# Patient Record
Sex: Male | Born: 1985 | Race: White | Hispanic: No | Marital: Single | Smoking: Current every day smoker
Health system: Southern US, Community
[De-identification: ages and names within clinical notes are randomized; demographics above are authoritative.]

## PROBLEM LIST (undated history)

## (undated) DIAGNOSIS — S21112A Laceration without foreign body of left front wall of thorax without penetration into thoracic cavity, initial encounter: Secondary | ICD-10-CM

## (undated) DIAGNOSIS — F909 Attention-deficit hyperactivity disorder, unspecified type: Secondary | ICD-10-CM

## (undated) DIAGNOSIS — J339 Nasal polyp, unspecified: Secondary | ICD-10-CM

## (undated) DIAGNOSIS — F319 Bipolar disorder, unspecified: Secondary | ICD-10-CM

## (undated) DIAGNOSIS — S0990XA Unspecified injury of head, initial encounter: Secondary | ICD-10-CM

## (undated) HISTORY — PX: VIDEO ASSISTED THORACOSCOPY (VATS)/ LOBECTOMY: SHX6169

---
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
## 2005-06-28 ENCOUNTER — Emergency Department (HOSPITAL_COMMUNITY): Admission: EM | Admit: 2005-06-28 | Discharge: 2005-06-28 | Payer: Self-pay | Admitting: Family Medicine

## 2005-10-29 ENCOUNTER — Emergency Department (HOSPITAL_COMMUNITY): Admission: AD | Admit: 2005-10-29 | Discharge: 2005-10-29 | Payer: Self-pay | Admitting: Family Medicine

## 2005-11-25 ENCOUNTER — Emergency Department (HOSPITAL_COMMUNITY): Admission: EM | Admit: 2005-11-25 | Discharge: 2005-11-25 | Payer: Self-pay | Admitting: Family Medicine

## 2011-01-28 ENCOUNTER — Emergency Department (HOSPITAL_COMMUNITY): Payer: Self-pay

## 2011-01-28 ENCOUNTER — Emergency Department (HOSPITAL_COMMUNITY)
Admission: EM | Admit: 2011-01-28 | Discharge: 2011-01-28 | Disposition: A | Discharge status: Home / Self Care | Payer: Self-pay | Attending: Emergency Medicine | Admitting: Emergency Medicine

## 2011-01-28 DIAGNOSIS — R61 Generalized hyperhidrosis: Secondary | ICD-10-CM | POA: Insufficient documentation

## 2011-01-28 DIAGNOSIS — T675XXA Heat exhaustion, unspecified, initial encounter: Secondary | ICD-10-CM | POA: Insufficient documentation

## 2011-01-28 DIAGNOSIS — R55 Syncope and collapse: Secondary | ICD-10-CM | POA: Insufficient documentation

## 2011-01-28 DIAGNOSIS — R5381 Other malaise: Secondary | ICD-10-CM | POA: Insufficient documentation

## 2011-01-28 DIAGNOSIS — N390 Urinary tract infection, site not specified: Secondary | ICD-10-CM | POA: Insufficient documentation

## 2011-01-28 DIAGNOSIS — R0609 Other forms of dyspnea: Secondary | ICD-10-CM | POA: Insufficient documentation

## 2011-01-28 DIAGNOSIS — X30XXXA Exposure to excessive natural heat, initial encounter: Secondary | ICD-10-CM | POA: Insufficient documentation

## 2011-01-28 DIAGNOSIS — R42 Dizziness and giddiness: Secondary | ICD-10-CM | POA: Insufficient documentation

## 2011-01-28 DIAGNOSIS — R109 Unspecified abdominal pain: Secondary | ICD-10-CM | POA: Insufficient documentation

## 2011-01-28 DIAGNOSIS — R300 Dysuria: Secondary | ICD-10-CM | POA: Insufficient documentation

## 2011-01-28 DIAGNOSIS — R0989 Other specified symptoms and signs involving the circulatory and respiratory systems: Secondary | ICD-10-CM | POA: Insufficient documentation

## 2011-01-28 DIAGNOSIS — R631 Polydipsia: Secondary | ICD-10-CM | POA: Insufficient documentation

## 2011-01-28 LAB — DIFFERENTIAL
Basophils Absolute: 0 10*3/uL (ref 0.0–0.1)
Basophils Relative: 0 % (ref 0–1)
Eosinophils Absolute: 0.1 10*3/uL (ref 0.0–0.7)
Eosinophils Relative: 1 % (ref 0–5)
Lymphocytes Relative: 18 % (ref 12–46)
Lymphs Abs: 1.2 10*3/uL (ref 0.7–4.0)
Monocytes Absolute: 0.6 10*3/uL (ref 0.1–1.0)
Monocytes Relative: 9 % (ref 3–12)
Neutro Abs: 4.8 10*3/uL (ref 1.7–7.7)
Neutrophils Relative %: 72 % (ref 43–77)

## 2011-01-28 LAB — POCT I-STAT, CHEM 8
BUN: 10 mg/dL (ref 6–23)
Calcium, Ion: 1.22 mmol/L (ref 1.12–1.32)
Chloride: 105 mEq/L (ref 96–112)
Creatinine, Ser: 1 mg/dL (ref 0.50–1.35)
Glucose, Bld: 124 mg/dL — ABNORMAL HIGH (ref 70–99)
HCT: 46 % (ref 39.0–52.0)
Hemoglobin: 15.6 g/dL (ref 13.0–17.0)
Potassium: 3.2 mEq/L — ABNORMAL LOW (ref 3.5–5.1)
Sodium: 142 mEq/L (ref 135–145)
TCO2: 23 mmol/L (ref 0–100)

## 2011-01-28 LAB — COMPREHENSIVE METABOLIC PANEL
AST: 14 U/L (ref 0–37)
BUN: 10 mg/dL (ref 6–23)
CO2: 26 mEq/L (ref 19–32)
Chloride: 107 mEq/L (ref 96–112)
Creatinine, Ser: 0.88 mg/dL (ref 0.50–1.35)
GFR calc Af Amer: >60 mL/min (ref >60)
GFR calc non Af Amer: >60 mL/min (ref >60)
Glucose, Bld: 123 mg/dL — ABNORMAL HIGH (ref 70–99)
Total Bilirubin: 0.5 mg/dL (ref 0.3–1.2)

## 2011-01-28 LAB — CBC
Hemoglobin: 15.2 g/dL (ref 13.0–17.0)
MCHC: 35.3 g/dL (ref 30.0–36.0)
RDW: 13.8 % (ref 11.5–15.5)
WBC: 6.7 10*3/uL (ref 4.0–10.5)

## 2011-01-28 LAB — CK: Total CK: 188 U/L (ref 7–232)

## 2011-01-28 LAB — URINALYSIS, ROUTINE W REFLEX MICROSCOPIC
Protein, ur: 100 mg/dL — AB
Specific Gravity, Urine: 1.028 (ref 1.005–1.030)
Urobilinogen, UA: 1 mg/dL (ref 0.0–1.0)

## 2011-01-28 LAB — URINE MICROSCOPIC-ADD ON

## 2011-01-29 LAB — GC/CHLAMYDIA PROBE AMP, URINE: GC Probe Amp, Urine: NEGATIVE

## 2011-09-21 ENCOUNTER — Emergency Department (HOSPITAL_COMMUNITY)
Admission: EM | Admit: 2011-09-21 | Discharge: 2011-09-22 | Disposition: A | Discharge status: Home / Self Care | Payer: Self-pay | Attending: Emergency Medicine | Admitting: Emergency Medicine

## 2011-09-21 ENCOUNTER — Encounter (HOSPITAL_COMMUNITY): Payer: Self-pay | Admitting: Emergency Medicine

## 2011-09-21 DIAGNOSIS — S21109A Unspecified open wound of unspecified front wall of thorax without penetration into thoracic cavity, initial encounter: Secondary | ICD-10-CM | POA: Insufficient documentation

## 2011-09-21 DIAGNOSIS — F909 Attention-deficit hyperactivity disorder, unspecified type: Secondary | ICD-10-CM | POA: Insufficient documentation

## 2011-09-21 DIAGNOSIS — S02400A Malar fracture unspecified, initial encounter for closed fracture: Secondary | ICD-10-CM | POA: Insufficient documentation

## 2011-09-21 DIAGNOSIS — M2550 Pain in unspecified joint: Secondary | ICD-10-CM | POA: Insufficient documentation

## 2011-09-21 DIAGNOSIS — T07XXXA Unspecified multiple injuries, initial encounter: Secondary | ICD-10-CM | POA: Insufficient documentation

## 2011-09-21 DIAGNOSIS — M25579 Pain in unspecified ankle and joints of unspecified foot: Secondary | ICD-10-CM | POA: Insufficient documentation

## 2011-09-21 DIAGNOSIS — S02401A Maxillary fracture, unspecified, initial encounter for closed fracture: Secondary | ICD-10-CM | POA: Insufficient documentation

## 2011-09-21 DIAGNOSIS — IMO0001 Reserved for inherently not codable concepts without codable children: Secondary | ICD-10-CM | POA: Insufficient documentation

## 2011-09-21 DIAGNOSIS — S0181XA Laceration without foreign body of other part of head, initial encounter: Secondary | ICD-10-CM

## 2011-09-21 DIAGNOSIS — S0180XA Unspecified open wound of other part of head, initial encounter: Secondary | ICD-10-CM | POA: Insufficient documentation

## 2011-09-21 DIAGNOSIS — R221 Localized swelling, mass and lump, neck: Secondary | ICD-10-CM | POA: Insufficient documentation

## 2011-09-21 DIAGNOSIS — M79609 Pain in unspecified limb: Secondary | ICD-10-CM | POA: Insufficient documentation

## 2011-09-21 DIAGNOSIS — R220 Localized swelling, mass and lump, head: Secondary | ICD-10-CM | POA: Insufficient documentation

## 2011-09-21 DIAGNOSIS — IMO0002 Reserved for concepts with insufficient information to code with codable children: Secondary | ICD-10-CM | POA: Insufficient documentation

## 2011-09-21 HISTORY — DX: Attention-deficit hyperactivity disorder, unspecified type: F90.9

## 2011-09-21 HISTORY — DX: Unspecified injury of head, initial encounter: S09.90XA
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
## 2011-09-22 ENCOUNTER — Emergency Department (HOSPITAL_COMMUNITY): Payer: Self-pay

## 2011-09-22 MED ORDER — TETANUS-DIPHTH-ACELL PERTUSSIS 5-2.5-18.5 LF-MCG/0.5 IM SUSP
0.5000 mL | Freq: Once | INTRAMUSCULAR | Status: AC
  Administered 2011-09-22: 0.5 mL via INTRAMUSCULAR

## 2011-09-22 MED ORDER — LIDOCAINE HCL (PF) 1 % IJ SOLN
5.0000 mL | Freq: Once | INTRAMUSCULAR | Status: AC
  Administered 2011-09-22: 5 mL via INTRADERMAL
  Filled 2011-09-22: qty 5

## 2011-09-22 MED ORDER — OXYCODONE-ACETAMINOPHEN 5-325 MG PO TABS: 1.0000 | ORAL_TABLET | ORAL | Status: AC | PRN

## 2011-09-22 MED ORDER — IBUPROFEN 800 MG PO TABS: 800.0000 mg | ORAL_TABLET | Freq: Three times a day (TID) | ORAL | Status: AC

## 2011-09-22 MED ORDER — DOUBLE ANTIBIOTIC 500-10000 UNIT/GM EX OINT
TOPICAL_OINTMENT | Freq: Once | CUTANEOUS | Status: AC
  Administered 2011-09-22: 1 via TOPICAL
  Filled 2011-09-22: qty 1

## 2011-09-22 MED ORDER — AMOXICILLIN-POT CLAVULANATE 875-125 MG PO TABS: 1.0000 | ORAL_TABLET | Freq: Two times a day (BID) | ORAL | Status: DC

## 2011-09-22 MED ORDER — IBUPROFEN 800 MG PO TABS
800.0000 mg | ORAL_TABLET | Freq: Once | ORAL | Status: AC
  Administered 2011-09-22: 800 mg via ORAL
  Filled 2011-09-22: qty 1

## 2011-09-22 MED ORDER — OXYCODONE-ACETAMINOPHEN 5-325 MG PO TABS
1.0000 | ORAL_TABLET | Freq: Once | ORAL | Status: AC
  Administered 2011-09-22: 1 via ORAL
  Filled 2011-09-22: qty 1

## 2011-09-22 MED ORDER — TETANUS-DIPHTH-ACELL PERTUSSIS 5-2.5-18.5 LF-MCG/0.5 IM SUSP
INTRAMUSCULAR | Status: AC
  Filled 2011-09-22: qty 0.5

## 2011-09-22 NOTE — Discharge Instructions (Signed)
Abrasions Abrasions are skin scrapes. Their treatment depends on how large and deep the abrasion is. Abrasions do not extend through all layers of the skin. A cut or lesion through all skin layers is called a laceration. HOME CARE INSTRUCTIONS   If you were given a dressing, change it at least once a day or as instructed by your caregiver. If the bandage sticks, soak it off with a solution of water or hydrogen peroxide.   Twice a day, wash the area with soap and water to remove all the cream/ointment. You may do this in a sink, under a tub faucet, or in a shower. Rinse off the soap and pat dry with a clean towel. Look for signs of infection (see below).   Reapply cream/ointment according to your caregiver's instruction. This will help prevent infection and keep the bandage from sticking. Telfa or gauze over the wound and under the dressing or wrap will also help keep the bandage from sticking.   If the bandage becomes wet, dirty, or develops a foul smell, change it as soon as possible.   Only take over-the-counter or prescription medicines for pain, discomfort, or fever as directed by your caregiver.  SEEK IMMEDIATE MEDICAL CARE IF:   Increasing pain in the wound.   Signs of infection develop: redness, swelling, surrounding area is tender to touch, or pus coming from the wound.   You have a fever.   Any foul smell coming from the wound or dressing.  Most skin wounds heal within ten days. Facial wounds heal faster. However, an infection may occur despite proper treatment. You should have the wound checked for signs of infection within 24 to 48 hours or sooner if problems arise. If you were not given a wound-check appointment, look closely at the wound yourself on the second day for early signs of infection listed above. MAKE SURE YOU:   Understand these instructions.   Will watch your condition.   Will get help right away if you are not doing well or get worse.  Document Released:  02/16/2005 Document Revised: 04/28/2011 Document Reviewed: 04/12/2011 [facility] Patient Information 2012 [place], [place].Abrasions An abrasion is a scraped area on the skin. Abrasions do not go through all layers of the skin.  HOME CARE  Change any bandages (dressings) as told by your doctor. If the bandage sticks, soak it off with warm, soapy water. Change the bandage if it gets wet, dirty, or starts to smell.   Wash the area with soap and water twice a day. Rinse off the soap. Pat the area dry with a clean towel.   Look at the injured area for signs of infection. Infection signs include redness, puffiness (swelling), tenderness, or yellowish white fluid (pus) coming from the wound.   Apply medicated cream as told by your doctor.   Only take medicine as told by your doctor.   Follow up with your doctor as told.  GET HELP RIGHT AWAY IF:   You have more pain in your wound.   You have redness, puffiness (swelling), or tenderness around your wound.   You have yellowish white fluid (pus) coming from your wound.   You have a fever.   A bad smell is coming from the wound or bandage.  MAKE SURE YOU:   Understand these instructions.   Will watch your condition.   Will get help right away if you are not doing well or get worse.  Document Released: 10/26/2007 Document Revised: 04/28/2011 Document Reviewed: 04/12/2011 [employer]  Patient Information 2012 [place], [place].Facial Fracture A facial fracture is a break in one of the bones of your face. HOME CARE INSTRUCTIONS   Protect the injured part of your face until it is healed.   Do not participate in activities which give chance for re-injury until your doctor approves.   Gently wash and dry your face.   Wear head and facial protection while riding a bicycle, motorcycle, or snowmobile.  SEEK MEDICAL CARE IF:   An oral temperature above 102 F (38.9 C) develops.   You have severe headaches or notice changes in your vision.    You have new numbness or tingling in your face.   You develop nausea (feeling sick to your stomach), vomiting or a stiff neck.  SEEK IMMEDIATE MEDICAL CARE IF:   You develop difficulty seeing or experience double vision.   You become dizzy, lightheaded, or faint.   You develop trouble speaking, breathing, or swallowing.   You have a watery discharge from your nose or ear.  MAKE SURE YOU:   Understand these instructions.   Will watch your condition.   Will get help right away if you are not doing well or get worse.  Document Released: 05/09/2005 Document Revised: 04/28/2011 Document Reviewed: 12/27/2007 [facility] Patient Information 2012 [place], [place].Facial Laceration A facial laceration is a cut on the face. Lacerations usually heal quickly, but they need special care to reduce scarring. It will take 1 to 2 years for the scar to lose its redness and to heal completely. TREATMENT  Some facial lacerations may not require closure. Some lacerations may not be able to be closed due to an increased risk of infection. It is important to see your caregiver as soon as possible after an injury to minimize the risk of infection and to maximize the opportunity for successful closure. If closure is appropriate, pain medicines may be given, if needed. The wound will be cleaned to help prevent infection. Your caregiver will use stitches (sutures), staples, wound glue (adhesive), or skin adhesive strips to repair the laceration. These tools bring the skin edges together to allow for faster healing and a better cosmetic outcome. However, all wounds will heal with a scar.  Once the wound has healed, scarring can be minimized by covering the wound with sunscreen during the day for 1 full year. Use a sunscreen with an SPF of at least 30. Sunscreen helps to reduce the pigment that will form in the scar. When applying sunscreen to a completely healed wound, massage the scar for a few minutes to help reduce  the appearance of the scar. Use circular motions with your fingertips, on and around the scar. Do not massage a healing wound. HOME CARE INSTRUCTIONS For sutures:  Keep the wound clean and dry.   If you were given a bandage (dressing), you should change it at least once a day. Also change the dressing if it becomes wet or dirty, or as directed by your caregiver.   Wash the wound with soap and water 2 times a day. Rinse the wound off with water to remove all soap. Pat the wound dry with a clean towel.   After cleaning, apply a thin layer of the antibiotic ointment recommended by your caregiver. This will help prevent infection and keep the dressing from sticking.   You may shower as usual after the first 24 hours. Do not soak the wound in water until the sutures are removed.   Only take over-the-counter or prescription medicines for  pain, discomfort, or fever as directed by your caregiver.   Get your sutures removed as directed by your caregiver. With facial lacerations, sutures should usually be taken out after 4 to 5 days to avoid stitch marks.   Wait a few days after your sutures are removed before applying makeup.  For skin adhesive strips:  Keep the wound clean and dry.   Do not get the skin adhesive strips wet. You may bathe carefully, using caution to keep the wound dry.   If the wound gets wet, pat it dry with a clean towel.   Skin adhesive strips will fall off on their own. You may trim the strips as the wound heals. Do not remove skin adhesive strips that are still stuck to the wound. They will fall off in time.  For wound adhesive:  You may briefly wet your wound in the shower or bath. Do not soak or scrub the wound. Do not swim. Avoid periods of heavy perspiration until the skin adhesive has fallen off on its own. After showering or bathing, gently pat the wound dry with a clean towel.   Do not apply liquid medicine, cream medicine, ointment medicine, or makeup to your  wound while the skin adhesive is in place. This may loosen the film before your wound is healed.   If a dressing is placed over the wound, be careful not to apply tape directly over the skin adhesive. This may cause the adhesive to be pulled off before the wound is healed.   Avoid prolonged exposure to sunlight or tanning lamps while the skin adhesive is in place. Exposure to ultraviolet light in the first year will darken the scar.   The skin adhesive will usually remain in place for 5 to 10 days, then naturally fall off the skin. Do not pick at the adhesive film.  You may need a tetanus shot if:  You cannot remember when you had your last tetanus shot.   You have never had a tetanus shot.  If you get a tetanus shot, your arm may swell, get red, and feel warm to the touch. This is common and not a problem. If you need a tetanus shot and you choose not to have one, there is a rare chance of getting tetanus. Sickness from tetanus can be serious. SEEK IMMEDIATE MEDICAL CARE IF:  You develop redness, pain, or swelling around the wound.   There is yellowish-white fluid (pus) coming from the wound.   You develop chills or a fever.  MAKE SURE YOU:  Understand these instructions.   Will watch your condition.   Will get help right away if you are not doing well or get worse.  Document Released: 06/16/2004 Document Revised: 04/28/2011 Document Reviewed: 11/01/2010 [facility] Patient Information 2012 [place], [place].
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
## 2011-09-22 NOTE — ED Notes (Signed)
Pt alert & oriented x4, stable gait. Pt given discharge instructions, paperwork & prescription(s). Patient instructed to stop at the registration desk to finish any additional paperwork. pt verbalized understanding. Pt left department w/ no further questions.  

## 2011-09-22 NOTE — ED Provider Notes (Signed)
Medical screening examination/treatment/procedure(s) were performed by non-physician practitioner and as supervising physician I was immediately available for consultation/collaboration.   Laray Anger, DO 09/22/11 [phone]

## 2011-09-28 ENCOUNTER — Encounter (HOSPITAL_COMMUNITY): Payer: Self-pay | Admitting: *Deleted

## 2011-09-28 ENCOUNTER — Emergency Department (HOSPITAL_COMMUNITY)
Admission: EM | Admit: 2011-09-28 | Discharge: 2011-09-28 | Disposition: A | Discharge status: Home / Self Care | Payer: Self-pay | Attending: Emergency Medicine | Admitting: Emergency Medicine

## 2011-09-28 DIAGNOSIS — F909 Attention-deficit hyperactivity disorder, unspecified type: Secondary | ICD-10-CM | POA: Insufficient documentation

## 2011-09-28 DIAGNOSIS — IMO0002 Reserved for concepts with insufficient information to code with codable children: Secondary | ICD-10-CM

## 2011-09-28 DIAGNOSIS — Z4802 Encounter for removal of sutures: Secondary | ICD-10-CM | POA: Insufficient documentation
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
## 2011-09-28 NOTE — Discharge Instructions (Signed)
Laceration Care, Adult °A laceration is a cut that goes through all layers of the skin. The cut goes into the tissue beneath the skin. °HOME CARE °For stitches (sutures) or staples: °· Keep the cut clean and dry.  °· If you have a bandage (dressing), change it at least once a day. Change the bandage if it gets wet or dirty, or as told by your doctor.  °· Wash the cut with soap and water 2 times a day. Rinse the cut with water. Pat it dry with a clean towel.  °· Put a thin layer of medicated cream on the cut as told by your doctor.  °· You may shower after the first 24 hours. Do not soak the cut in water until the stitches are removed.  °· Only take medicines as told by your doctor.  °· Have your stitches or staples removed as told by your doctor.  °For skin adhesive strips: °· Keep the cut clean and dry.  °· Do not get the strips wet. You may take a bath, but be careful to keep the cut dry.  °· If the cut gets wet, pat it dry with a clean towel.  °· The strips will fall off on their own. Do not remove the strips that are still stuck to the cut.  °For wound glue: °· You may shower or take baths. Do not soak or scrub the cut. Do not swim. Avoid heavy sweating until the glue falls off on its own. After a shower or bath, pat the cut dry with a clean towel.  °· Do not put medicine on your cut until the glue falls off.  °· If you have a bandage, do not put tape over the glue.  °· Avoid lots of sunlight or tanning lamps until the glue falls off. Put sunscreen on the cut for the first year to reduce your scar.  °· The glue will fall off on its own. Do not pick at the glue.  °You may need a tetanus shot if: °· You cannot remember when you had your last tetanus shot.  °· You have never had a tetanus shot.  °If you need a tetanus shot and you choose not to have one, you may get tetanus. Sickness from tetanus can be serious. °GET HELP RIGHT AWAY IF:  °· Your pain does not get better with medicine.  °· Your arm, hand, leg, or  foot loses feeling (numbness) or changes color.  °· Your cut is bleeding.  °· Your joint feels weak, or you cannot use your joint.  °· You have painful lumps on your body.  °· Your cut is red, puffy (swollen), or painful.  °· You have a red line on the skin near the cut.  °· You have yellowish-white fluid (pus) coming from the cut.  °· You have a fever.  °· You have a bad smell coming from the cut or bandage.  °· Your cut breaks open before or after stitches are removed.  °· You notice something coming out of the cut, such as wood or glass.  °· You cannot move a finger or toe.  °MAKE SURE YOU:  °· Understand these instructions.  °· Will watch your condition.  °· Will get help right away if you are not doing well or get worse.  °Document Released: 10/26/2007 Document Revised: 04/28/2011 Document Reviewed: 11/02/2010 °[employer]® Patient Information ©2012 [employer]. °

## 2011-09-28 NOTE — ED Notes (Signed)
Suture removal. Placed to facial area last Wednesday.

## 2011-09-29 ENCOUNTER — Encounter (HOSPITAL_COMMUNITY): Payer: Self-pay | Admitting: Emergency Medicine

## 2011-09-29 ENCOUNTER — Emergency Department (HOSPITAL_COMMUNITY)
Admission: EM | Admit: 2011-09-29 | Discharge: 2011-09-29 | Disposition: A | Discharge status: Home / Self Care | Payer: Self-pay | Attending: Emergency Medicine | Admitting: Emergency Medicine

## 2011-09-29 DIAGNOSIS — M79641 Pain in right hand: Secondary | ICD-10-CM

## 2011-09-29 DIAGNOSIS — M79609 Pain in unspecified limb: Secondary | ICD-10-CM | POA: Insufficient documentation

## 2011-09-29 DIAGNOSIS — IMO0002 Reserved for concepts with insufficient information to code with codable children: Secondary | ICD-10-CM | POA: Insufficient documentation

## 2011-09-29 DIAGNOSIS — F909 Attention-deficit hyperactivity disorder, unspecified type: Secondary | ICD-10-CM | POA: Insufficient documentation

## 2011-09-29 DIAGNOSIS — F17200 Nicotine dependence, unspecified, uncomplicated: Secondary | ICD-10-CM | POA: Insufficient documentation

## 2011-09-29 DIAGNOSIS — R51 Headache: Secondary | ICD-10-CM | POA: Insufficient documentation

## 2011-09-29 MED ORDER — OXYCODONE-ACETAMINOPHEN 7.5-325 MG PO TABS: 1.0000 | ORAL_TABLET | Freq: Four times a day (QID) | ORAL | Status: AC | PRN
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
## 2011-09-29 NOTE — Discharge Instructions (Signed)
Pain Medicine Instructions You have been given a prescription for pain medicines. These medicines may affect your ability to think clearly. They may also affect your ability to perform physical activities. Take these medicines only as needed for pain. You do not need to take them if you are not having pain, unless directed by your caregiver. You can take less than the prescribed dose if you find a smaller amount of medicine controls the pain. It may not be possible to make all of your pain go away, but you should be comfortable enough to move, breathe, and take care of yourself. After you start taking pain medicines, while taking the medicines, and for 8 hours after stopping the medicines:  Do not drive.   Do not operate machinery.   Do not operate power tools.   Do not sign legal documents.   Do not supervise children by yourself.   Do not participate in activities that require climbing or being in high places.   Do not enter a body of water (lake, river, ocean, spa, swimming pool) without an adult nearby who can help you.  You may have been prescribed a pain medicine that contains acetaminophen (paracetamol). If so, take only the amount directed by your caregiver. Do not take any other acetaminophen while taking this medicine. An overdose of acetaminophen can result in severe liver damage. If you are taking other medicines, check the active ingredients for acetaminophen. Acetaminophen is found in hundreds of over-the-counter and prescription medicines. These include cold relief products, menstrual cramp relief medicines, fever-reducing medicines, acid indigestion relief products, and pain relief products. HOME CARE INSTRUCTIONS   Do not drink alcohol, take sleeping pills, or take other medicines until at least 8 hours after your last dose of pain medicine, or as directed by your caregiver.   Use a bulk stool softener if you become constipated from your pain medicines. Increasing your intake  of fruits and vegetables will also help.   Write down the times when you take your medicines. Look at the times before taking your next dose of medicine. It is easy to become confused while on pain medicines. Recording the times helps you to avoid an overdose.  SEEK MEDICAL CARE IF:  Your medicine is not helping the pain go away.   You vomit or have diarrhea shortly after taking the medicine.   You develop new pain in areas that did not hurt before.  SEEK IMMEDIATE MEDICAL CARE IF:  You feel dizzy or faint.   You feel there are other problems that might be caused by your medicine.  MAKE SURE YOU:   Understand these instructions.   Will watch your condition.   Will get help right away if you are not doing well or get worse.  Document Released: 08/15/2000 Document Revised: 04/28/2011 Document Reviewed: 04/23/2010 [facility] Patient Information 2012 [place], [place].   RESERVE THE PERCOCET FOR PAIN NOT CONTROLLED WITH IBUPROFEN OR TYLENOL.  DO NOT EXCEED 3000 MG OF TYLENOL PER 24 HOURS.  FOLLOW-UP WITH DR Suszanne Conners AS PREVIOUSLY RECOMMENDED--CALL THE OFFICE TOMORROW TO SEE IF THEY CAN SEE YOU SOONER THAN CURRENTLY SCHEDULED.
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
## 2011-10-02 NOTE — ED Provider Notes (Signed)
Medical screening examination/treatment/procedure(s) were performed by non-physician practitioner and as supervising physician I was immediately available for consultation/collaboration.   Dione Booze, MD 10/02/11 [phone]

## 2011-10-06 ENCOUNTER — Ambulatory Visit (INDEPENDENT_AMBULATORY_CARE_PROVIDER_SITE_OTHER): Payer: Self-pay | Admitting: Otolaryngology

## 2011-10-21 ENCOUNTER — Encounter (HOSPITAL_COMMUNITY): Payer: Self-pay | Admitting: Emergency Medicine

## 2011-10-21 ENCOUNTER — Emergency Department (HOSPITAL_COMMUNITY)
Admission: EM | Admit: 2011-10-21 | Discharge: 2011-10-22 | Disposition: A | Discharge status: Home / Self Care | Payer: Self-pay | Attending: Emergency Medicine | Admitting: Emergency Medicine

## 2011-10-21 DIAGNOSIS — R51 Headache: Secondary | ICD-10-CM | POA: Insufficient documentation

## 2011-10-21 DIAGNOSIS — F909 Attention-deficit hyperactivity disorder, unspecified type: Secondary | ICD-10-CM | POA: Insufficient documentation

## 2011-10-21 DIAGNOSIS — F17200 Nicotine dependence, unspecified, uncomplicated: Secondary | ICD-10-CM | POA: Insufficient documentation

## 2011-10-21 DIAGNOSIS — H5710 Ocular pain, unspecified eye: Secondary | ICD-10-CM | POA: Insufficient documentation
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
## 2011-10-22 MED ORDER — OXYCODONE-ACETAMINOPHEN 5-325 MG PO TABS: 2.0000 | ORAL_TABLET | ORAL | Status: AC | PRN

## 2011-10-22 MED ORDER — GABAPENTIN 300 MG PO CAPS: ORAL_CAPSULE | ORAL | Status: DC

## 2011-10-22 NOTE — Discharge Instructions (Signed)
Start neurontin as directed, using percocet for breakthrough pain but follow up with ENT is VERY important for further evaluation and management of ongoing pain. Return to ER for any emergent changing or worsening of symptoms.

## 2011-10-22 NOTE — ED Provider Notes (Signed)
Medical screening examination/treatment/procedure(s) were performed by non-physician practitioner and as supervising physician I was immediately available for consultation/collaboration.   Quanta Roher M Deanza Upperman, MD 10/22/11 0600 
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
## 2011-11-13 ENCOUNTER — Encounter (HOSPITAL_COMMUNITY): Payer: Self-pay

## 2011-11-13 ENCOUNTER — Emergency Department (HOSPITAL_COMMUNITY)
Admission: EM | Admit: 2011-11-13 | Discharge: 2011-11-13 | Disposition: A | Discharge status: Home / Self Care | Payer: Self-pay | Attending: Emergency Medicine | Admitting: Emergency Medicine

## 2011-11-13 ENCOUNTER — Emergency Department (HOSPITAL_COMMUNITY): Payer: Self-pay

## 2011-11-13 DIAGNOSIS — S199XXA Unspecified injury of neck, initial encounter: Secondary | ICD-10-CM | POA: Insufficient documentation

## 2011-11-13 DIAGNOSIS — F17200 Nicotine dependence, unspecified, uncomplicated: Secondary | ICD-10-CM | POA: Insufficient documentation

## 2011-11-13 DIAGNOSIS — Y93H2 Activity, gardening and landscaping: Secondary | ICD-10-CM | POA: Insufficient documentation

## 2011-11-13 DIAGNOSIS — W208XXA Other cause of strike by thrown, projected or falling object, initial encounter: Secondary | ICD-10-CM | POA: Insufficient documentation

## 2011-11-13 DIAGNOSIS — F909 Attention-deficit hyperactivity disorder, unspecified type: Secondary | ICD-10-CM | POA: Insufficient documentation

## 2011-11-13 DIAGNOSIS — S0993XA Unspecified injury of face, initial encounter: Secondary | ICD-10-CM | POA: Insufficient documentation

## 2011-11-13 MED ORDER — HYDROCODONE-ACETAMINOPHEN 7.5-500 MG/15ML PO SOLN: 15.0000 mL | Freq: Four times a day (QID) | ORAL | Status: AC | PRN

## 2011-11-13 MED ORDER — OXYCODONE-ACETAMINOPHEN 5-325 MG PO TABS
1.0000 | ORAL_TABLET | Freq: Once | ORAL | Status: AC
  Administered 2011-11-13: 1 via ORAL
  Filled 2011-11-13: qty 1
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
## 2011-11-15 NOTE — ED Provider Notes (Signed)
Medical screening examination/treatment/procedure(s) were performed by non-physician practitioner and as supervising physician I was immediately available for consultation/collaboration.  Juliet Rude. Rubin Payor, MD 11/15/11 [phone]

## 2012-01-29 ENCOUNTER — Emergency Department (HOSPITAL_COMMUNITY): Payer: Self-pay

## 2012-01-29 ENCOUNTER — Encounter (HOSPITAL_COMMUNITY): Payer: Self-pay | Admitting: Emergency Medicine

## 2012-01-29 ENCOUNTER — Emergency Department (HOSPITAL_COMMUNITY)
Admission: EM | Admit: 2012-01-29 | Discharge: 2012-01-29 | Disposition: A | Discharge status: Home / Self Care | Payer: Self-pay | Attending: Emergency Medicine | Admitting: Emergency Medicine

## 2012-01-29 DIAGNOSIS — R079 Chest pain, unspecified: Secondary | ICD-10-CM | POA: Insufficient documentation

## 2012-01-29 DIAGNOSIS — M549 Dorsalgia, unspecified: Secondary | ICD-10-CM | POA: Insufficient documentation

## 2012-01-29 DIAGNOSIS — F909 Attention-deficit hyperactivity disorder, unspecified type: Secondary | ICD-10-CM | POA: Insufficient documentation

## 2012-01-29 DIAGNOSIS — M79609 Pain in unspecified limb: Secondary | ICD-10-CM | POA: Insufficient documentation

## 2012-01-29 DIAGNOSIS — N390 Urinary tract infection, site not specified: Secondary | ICD-10-CM

## 2012-01-29 DIAGNOSIS — M25519 Pain in unspecified shoulder: Secondary | ICD-10-CM | POA: Insufficient documentation

## 2012-01-29 DIAGNOSIS — F17200 Nicotine dependence, unspecified, uncomplicated: Secondary | ICD-10-CM | POA: Insufficient documentation

## 2012-01-29 DIAGNOSIS — M542 Cervicalgia: Secondary | ICD-10-CM | POA: Insufficient documentation

## 2012-01-29 MED ORDER — HYDROCODONE-ACETAMINOPHEN 5-325 MG PO TABS: 1.0000 | ORAL_TABLET | ORAL | Status: AC | PRN

## 2012-01-29 MED ORDER — MORPHINE SULFATE 2 MG/ML IJ SOLN
INTRAMUSCULAR | Status: AC
  Administered 2012-01-29: 2 mg via INTRAVENOUS
  Filled 2012-01-29: qty 2

## 2012-01-29 MED ORDER — IBUPROFEN 800 MG PO TABS: 800.0000 mg | ORAL_TABLET | Freq: Three times a day (TID) | ORAL | Status: AC

## 2012-01-29 MED ORDER — SODIUM CHLORIDE 0.9 % IV SOLN
Freq: Once | INTRAVENOUS | Status: AC
  Administered 2012-01-29: 05:00:00 via INTRAVENOUS

## 2012-01-29 MED ORDER — MORPHINE SULFATE 4 MG/ML IJ SOLN: 2.0000 mg | Freq: Once | INTRAMUSCULAR | Status: AC

## 2012-01-29 MED ORDER — ONDANSETRON HCL 4 MG/2ML IJ SOLN
4.0000 mg | Freq: Once | INTRAMUSCULAR | Status: AC
  Administered 2012-01-29: 4 mg via INTRAVENOUS
  Filled 2012-01-29: qty 2

## 2012-01-29 MED ORDER — IOHEXOL 300 MG/ML  SOLN
100.0000 mL | Freq: Once | INTRAMUSCULAR | Status: AC | PRN
  Administered 2012-01-29: 100 mL via INTRAVENOUS
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
## 2012-01-29 NOTE — ED Notes (Signed)
Cab has arrived. Patient walked out of ED to cab by RN.
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
## 2012-01-30 MED ORDER — CEPHALEXIN 500 MG PO CAPS: 500.0000 mg | ORAL_CAPSULE | Freq: Four times a day (QID) | ORAL | Status: AC
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
## 2012-02-16 ENCOUNTER — Ambulatory Visit (INDEPENDENT_AMBULATORY_CARE_PROVIDER_SITE_OTHER): Payer: Self-pay | Admitting: Otolaryngology

## 2012-02-16 DIAGNOSIS — J0100 Acute maxillary sinusitis, unspecified: Secondary | ICD-10-CM

## 2012-02-16 DIAGNOSIS — J330 Polyp of nasal cavity: Secondary | ICD-10-CM

## 2012-02-16 DIAGNOSIS — J342 Deviated nasal septum: Secondary | ICD-10-CM

## 2012-02-16 DIAGNOSIS — J310 Chronic rhinitis: Secondary | ICD-10-CM

## 2012-03-15 ENCOUNTER — Ambulatory Visit (INDEPENDENT_AMBULATORY_CARE_PROVIDER_SITE_OTHER): Payer: Self-pay | Admitting: Otolaryngology

## 2012-04-12 ENCOUNTER — Encounter (HOSPITAL_COMMUNITY): Payer: Self-pay | Admitting: *Deleted

## 2012-04-12 ENCOUNTER — Emergency Department (HOSPITAL_COMMUNITY): Payer: Self-pay

## 2012-04-12 ENCOUNTER — Emergency Department (HOSPITAL_COMMUNITY)
Admission: EM | Admit: 2012-04-12 | Discharge: 2012-04-12 | Disposition: A | Discharge status: Home / Self Care | Payer: Self-pay | Attending: Emergency Medicine | Admitting: Emergency Medicine

## 2012-04-12 DIAGNOSIS — Y9355 Activity, bike riding: Secondary | ICD-10-CM | POA: Insufficient documentation

## 2012-04-12 DIAGNOSIS — Y92410 Unspecified street and highway as the place of occurrence of the external cause: Secondary | ICD-10-CM | POA: Insufficient documentation

## 2012-04-12 DIAGNOSIS — F909 Attention-deficit hyperactivity disorder, unspecified type: Secondary | ICD-10-CM | POA: Insufficient documentation

## 2012-04-12 DIAGNOSIS — S8010XA Contusion of unspecified lower leg, initial encounter: Secondary | ICD-10-CM | POA: Insufficient documentation

## 2012-04-12 DIAGNOSIS — F17200 Nicotine dependence, unspecified, uncomplicated: Secondary | ICD-10-CM | POA: Insufficient documentation

## 2012-04-12 DIAGNOSIS — T07XXXA Unspecified multiple injuries, initial encounter: Secondary | ICD-10-CM

## 2012-04-12 MED ORDER — HYDROCODONE-ACETAMINOPHEN 5-325 MG PO TABS
1.0000 | ORAL_TABLET | Freq: Once | ORAL | Status: AC
  Administered 2012-04-12: 1 via ORAL
  Filled 2012-04-12: qty 1

## 2012-04-12 MED ORDER — IBUPROFEN 800 MG PO TABS
800.0000 mg | ORAL_TABLET | Freq: Once | ORAL | Status: AC
  Administered 2012-04-12: 800 mg via ORAL
  Filled 2012-04-12: qty 1

## 2012-04-12 MED ORDER — HYDROCODONE-ACETAMINOPHEN 5-325 MG PO TABS: 1.0000 | ORAL_TABLET | Freq: Four times a day (QID) | ORAL | Status: AC | PRN

## 2012-04-12 NOTE — ED Provider Notes (Signed)
Medical screening examination/treatment/procedure(s) were performed by non-physician practitioner and as supervising physician I was immediately available for consultation/collaboration. Devoria Albe, MD, Armando Gang   Ward Givens, MD 04/12/12 [phone]
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
## 2012-04-14 ENCOUNTER — Encounter (HOSPITAL_COMMUNITY): Payer: Self-pay | Admitting: *Deleted

## 2012-04-14 ENCOUNTER — Emergency Department (HOSPITAL_COMMUNITY)
Admission: EM | Admit: 2012-04-14 | Discharge: 2012-04-14 | Disposition: A | Discharge status: Home / Self Care | Payer: Self-pay | Attending: Emergency Medicine | Admitting: Emergency Medicine

## 2012-04-14 DIAGNOSIS — Z87828 Personal history of other (healed) physical injury and trauma: Secondary | ICD-10-CM | POA: Insufficient documentation

## 2012-04-14 DIAGNOSIS — Y929 Unspecified place or not applicable: Secondary | ICD-10-CM | POA: Insufficient documentation

## 2012-04-14 DIAGNOSIS — F17200 Nicotine dependence, unspecified, uncomplicated: Secondary | ICD-10-CM | POA: Insufficient documentation

## 2012-04-14 DIAGNOSIS — S7002XA Contusion of left hip, initial encounter: Secondary | ICD-10-CM

## 2012-04-14 DIAGNOSIS — F909 Attention-deficit hyperactivity disorder, unspecified type: Secondary | ICD-10-CM | POA: Insufficient documentation

## 2012-04-14 DIAGNOSIS — Y9319 Activity, other involving water and watercraft: Secondary | ICD-10-CM | POA: Insufficient documentation

## 2012-04-14 DIAGNOSIS — S7000XA Contusion of unspecified hip, initial encounter: Secondary | ICD-10-CM | POA: Insufficient documentation

## 2012-04-14 DIAGNOSIS — IMO0002 Reserved for concepts with insufficient information to code with codable children: Secondary | ICD-10-CM | POA: Insufficient documentation

## 2012-04-14 MED ORDER — HYDROCODONE-ACETAMINOPHEN 5-325 MG PO TABS: ORAL_TABLET | ORAL | Status: DC

## 2012-04-14 MED ORDER — HYDROCODONE-ACETAMINOPHEN 5-325 MG PO TABS: 1.0000 | ORAL_TABLET | Freq: Once | ORAL | Status: DC

## 2012-04-14 MED ORDER — HYDROCODONE-ACETAMINOPHEN 5-325 MG PO TABS
1.0000 | ORAL_TABLET | Freq: Once | ORAL | Status: AC
  Administered 2012-04-14: 1 via ORAL
  Filled 2012-04-14: qty 1

## 2012-04-14 MED ORDER — IBUPROFEN 800 MG PO TABS
800.0000 mg | ORAL_TABLET | Freq: Once | ORAL | Status: AC
  Administered 2012-04-14: 800 mg via ORAL
  Filled 2012-04-14: qty 1

## 2012-04-14 NOTE — ED Notes (Signed)
Pt states he was recently seen here for same pain to hip from a fall. States pain meds arent' helping. Given Norco last visit.
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
## 2012-04-14 NOTE — ED Provider Notes (Signed)
Medical screening examination/treatment/procedure(s) were performed by non-physician practitioner and as supervising physician I was immediately available for consultation/collaboration. Devoria Albe, MD, [facility] Gang   Ward Givens, MD 04/14/12 2114

## 2012-04-26 ENCOUNTER — Emergency Department (HOSPITAL_COMMUNITY)
Admission: EM | Admit: 2012-04-26 | Discharge: 2012-04-26 | Disposition: A | Discharge status: Home / Self Care | Payer: Self-pay | Attending: Emergency Medicine | Admitting: Emergency Medicine

## 2012-04-26 ENCOUNTER — Emergency Department (HOSPITAL_COMMUNITY): Payer: Self-pay

## 2012-04-26 ENCOUNTER — Encounter (HOSPITAL_COMMUNITY): Payer: Self-pay | Admitting: *Deleted

## 2012-04-26 DIAGNOSIS — S99919A Unspecified injury of unspecified ankle, initial encounter: Secondary | ICD-10-CM | POA: Insufficient documentation

## 2012-04-26 DIAGNOSIS — X500XXA Overexertion from strenuous movement or load, initial encounter: Secondary | ICD-10-CM | POA: Insufficient documentation

## 2012-04-26 DIAGNOSIS — S46911A Strain of unspecified muscle, fascia and tendon at shoulder and upper arm level, right arm, initial encounter: Secondary | ICD-10-CM

## 2012-04-26 DIAGNOSIS — W208XXA Other cause of strike by thrown, projected or falling object, initial encounter: Secondary | ICD-10-CM | POA: Insufficient documentation

## 2012-04-26 DIAGNOSIS — S9030XA Contusion of unspecified foot, initial encounter: Secondary | ICD-10-CM | POA: Insufficient documentation

## 2012-04-26 DIAGNOSIS — S9031XA Contusion of right foot, initial encounter: Secondary | ICD-10-CM

## 2012-04-26 DIAGNOSIS — F909 Attention-deficit hyperactivity disorder, unspecified type: Secondary | ICD-10-CM | POA: Insufficient documentation

## 2012-04-26 DIAGNOSIS — Y9389 Activity, other specified: Secondary | ICD-10-CM | POA: Insufficient documentation

## 2012-04-26 DIAGNOSIS — S43499A Other sprain of unspecified shoulder joint, initial encounter: Secondary | ICD-10-CM | POA: Insufficient documentation

## 2012-04-26 DIAGNOSIS — Z87828 Personal history of other (healed) physical injury and trauma: Secondary | ICD-10-CM | POA: Insufficient documentation

## 2012-04-26 DIAGNOSIS — F17200 Nicotine dependence, unspecified, uncomplicated: Secondary | ICD-10-CM | POA: Insufficient documentation

## 2012-04-26 DIAGNOSIS — Y929 Unspecified place or not applicable: Secondary | ICD-10-CM | POA: Insufficient documentation

## 2012-04-26 DIAGNOSIS — S8990XA Unspecified injury of unspecified lower leg, initial encounter: Secondary | ICD-10-CM | POA: Insufficient documentation

## 2012-04-26 DIAGNOSIS — W240XXA Contact with lifting devices, not elsewhere classified, initial encounter: Secondary | ICD-10-CM | POA: Insufficient documentation

## 2012-04-26 MED ORDER — IBUPROFEN 800 MG PO TABS
800.0000 mg | ORAL_TABLET | Freq: Once | ORAL | Status: AC
  Administered 2012-04-26: 800 mg via ORAL
  Filled 2012-04-26: qty 1

## 2012-04-26 MED ORDER — ACETAMINOPHEN 500 MG PO TABS
1000.0000 mg | ORAL_TABLET | Freq: Once | ORAL | Status: AC
  Administered 2012-04-26: 1000 mg via ORAL
  Filled 2012-04-26: qty 2

## 2012-04-26 NOTE — ED Notes (Signed)
Washer fell on right foot and shoulder pain from moving the washer this afternoon

## 2012-04-26 NOTE — ED Notes (Signed)
Pt alert & oriented x4, stable gait. Patient given discharge instructions, paperwork & prescription(s). Patient  instructed to stop at the registration desk to finish any additional paperwork. Patient verbalized understanding. Pt left department w/ no further questions. 
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
## 2012-04-27 NOTE — ED Provider Notes (Signed)
Medical screening examination/treatment/procedure(s) were performed by non-physician practitioner and as supervising physician I was immediately available for consultation/collaboration.   Tamala Manzer W. Margaret Staggs, MD 04/27/12 1628 

## 2012-10-11 ENCOUNTER — Emergency Department (HOSPITAL_COMMUNITY): Payer: Self-pay

## 2012-10-11 ENCOUNTER — Encounter (HOSPITAL_COMMUNITY): Payer: Self-pay | Admitting: *Deleted

## 2012-10-11 ENCOUNTER — Emergency Department (HOSPITAL_COMMUNITY)
Admission: EM | Admit: 2012-10-11 | Discharge: 2012-10-11 | Disposition: A | Discharge status: Home / Self Care | Payer: Self-pay | Attending: Emergency Medicine | Admitting: Emergency Medicine

## 2012-10-11 DIAGNOSIS — S61409A Unspecified open wound of unspecified hand, initial encounter: Secondary | ICD-10-CM | POA: Insufficient documentation

## 2012-10-11 DIAGNOSIS — S61412A Laceration without foreign body of left hand, initial encounter: Secondary | ICD-10-CM

## 2012-10-11 DIAGNOSIS — Z8659 Personal history of other mental and behavioral disorders: Secondary | ICD-10-CM | POA: Insufficient documentation

## 2012-10-11 DIAGNOSIS — Y929 Unspecified place or not applicable: Secondary | ICD-10-CM | POA: Insufficient documentation

## 2012-10-11 DIAGNOSIS — Y9389 Activity, other specified: Secondary | ICD-10-CM | POA: Insufficient documentation

## 2012-10-11 DIAGNOSIS — F17200 Nicotine dependence, unspecified, uncomplicated: Secondary | ICD-10-CM | POA: Insufficient documentation

## 2012-10-11 DIAGNOSIS — Z87828 Personal history of other (healed) physical injury and trauma: Secondary | ICD-10-CM | POA: Insufficient documentation

## 2012-10-11 DIAGNOSIS — Z888 Allergy status to other drugs, medicaments and biological substances status: Secondary | ICD-10-CM | POA: Insufficient documentation

## 2012-10-11 DIAGNOSIS — W268XXA Contact with other sharp object(s), not elsewhere classified, initial encounter: Secondary | ICD-10-CM | POA: Insufficient documentation

## 2012-10-11 HISTORY — DX: Bipolar disorder, unspecified: F31.9

## 2012-10-11 MED ORDER — OXYCODONE-ACETAMINOPHEN 5-325 MG PO TABS
2.0000 | ORAL_TABLET | Freq: Once | ORAL | Status: AC
  Administered 2012-10-11: 2 via ORAL
  Filled 2012-10-11: qty 2

## 2012-10-11 NOTE — ED Notes (Signed)
RFA lac happened last  Night 2100, left hand lacs happened today, 1200.

## 2012-10-11 NOTE — ED Provider Notes (Signed)
Medical screening examination/treatment/procedure(s) were performed by non-physician practitioner and as supervising physician I was immediately available for consultation/collaboration. Micheal Albe, MD, [facility]   Ward Givens, MD 10/11/12 [phone]
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
## 2012-10-11 NOTE — ED Notes (Signed)
5/21: rt. Posterior, lower forearm: work utility blade slit arm open. Cleaned with alchol; 5/22: piece of glass cut the ant. Side of lt. Hand. Bleeding controlled.

## 2012-12-02 ENCOUNTER — Encounter (HOSPITAL_COMMUNITY): Payer: Self-pay

## 2012-12-02 ENCOUNTER — Emergency Department (HOSPITAL_COMMUNITY)
Admission: EM | Admit: 2012-12-02 | Discharge: 2012-12-02 | Disposition: A | Discharge status: Home / Self Care | Payer: Self-pay | Attending: Emergency Medicine | Admitting: Emergency Medicine

## 2012-12-02 DIAGNOSIS — F17200 Nicotine dependence, unspecified, uncomplicated: Secondary | ICD-10-CM | POA: Insufficient documentation

## 2012-12-02 DIAGNOSIS — R109 Unspecified abdominal pain: Secondary | ICD-10-CM

## 2012-12-02 DIAGNOSIS — Z8659 Personal history of other mental and behavioral disorders: Secondary | ICD-10-CM | POA: Insufficient documentation

## 2012-12-02 DIAGNOSIS — Z87828 Personal history of other (healed) physical injury and trauma: Secondary | ICD-10-CM | POA: Insufficient documentation

## 2012-12-02 DIAGNOSIS — R1013 Epigastric pain: Secondary | ICD-10-CM | POA: Insufficient documentation

## 2012-12-02 LAB — CBC WITH DIFFERENTIAL/PLATELET
Basophils Relative: 0 % (ref 0–1)
Eosinophils Absolute: 0.1 10*3/uL (ref 0.0–0.7)
Eosinophils Relative: 2 % (ref 0–5)
Hemoglobin: 14.1 g/dL (ref 13.0–17.0)
Lymphs Abs: 2 10*3/uL (ref 0.7–4.0)
MCH: 29.3 pg (ref 26.0–34.0)
MCHC: 34.2 g/dL (ref 30.0–36.0)
MCV: 85.7 fL (ref 78.0–100.0)
Monocytes Relative: 10 % (ref 3–12)
Neutrophils Relative %: 62 % (ref 43–77)
RBC: 4.81 MIL/uL (ref 4.22–5.81)

## 2012-12-02 LAB — COMPREHENSIVE METABOLIC PANEL
Albumin: 4.2 g/dL (ref 3.5–5.2)
BUN: 14 mg/dL (ref 6–23)
Calcium: 9.2 mg/dL (ref 8.4–10.5)
Creatinine, Ser: 1.01 mg/dL (ref 0.50–1.35)
GFR calc Af Amer: >90 mL/min (ref >90)
Glucose, Bld: 108 mg/dL — ABNORMAL HIGH (ref 70–99)
Total Protein: 6.6 g/dL (ref 6.0–8.3)

## 2012-12-02 LAB — LIPASE, BLOOD: Lipase: 22 U/L (ref 11–59)

## 2012-12-02 MED ORDER — SODIUM CHLORIDE 0.9 % IV SOLN
Freq: Once | INTRAVENOUS | Status: AC
  Administered 2012-12-02: 04:00:00 via INTRAVENOUS

## 2012-12-02 MED ORDER — PANTOPRAZOLE SODIUM 40 MG IV SOLR
40.0000 mg | Freq: Once | INTRAVENOUS | Status: AC
  Administered 2012-12-02: 40 mg via INTRAVENOUS
  Filled 2012-12-02: qty 40

## 2012-12-02 MED ORDER — MORPHINE SULFATE 4 MG/ML IJ SOLN
2.0000 mg | Freq: Once | INTRAMUSCULAR | Status: AC
  Administered 2012-12-02: 2 mg via INTRAVENOUS
  Filled 2012-12-02: qty 1

## 2012-12-02 MED ORDER — POTASSIUM CHLORIDE CRYS ER 20 MEQ PO TBCR
60.0000 meq | EXTENDED_RELEASE_TABLET | Freq: Once | ORAL | Status: AC
  Administered 2012-12-02: 60 meq via ORAL
  Filled 2012-12-02: qty 3

## 2012-12-02 MED ORDER — ONDANSETRON HCL 4 MG/2ML IJ SOLN
4.0000 mg | Freq: Once | INTRAMUSCULAR | Status: AC
  Administered 2012-12-02: 4 mg via INTRAVENOUS
  Filled 2012-12-02: qty 2

## 2012-12-02 NOTE — ED Provider Notes (Signed)
History    CSN: [id number] Arrival date & time 12/02/12  0224  First MD Initiated Contact with Patient 12/02/12 0253     Chief Complaint  Patient presents with  . Abdominal Pain   (Consider location/radiation/quality/duration/timing/severity/associated sxs/prior Treatment) HPI HPI Comments: Micheal Gentry is a 27 y.o. male who presents to the Emergency Department complaining of abdominal pain since 10 AM yesterday. He describes the pain in the epigastric region as a cramping pain. He was able to eat, did get nauseated, no vomiting. Denies fever, chills.    Past Medical History  Diagnosis Date  . ADHD (attention deficit hyperactivity disorder)   . Head trauma   . Bipolar 1 disorder    History reviewed. No pertinent past surgical history. No family history on file. History  Substance Use Topics  . Smoking status: Current Every Day Smoker    Types: Cigarettes  . Smokeless tobacco: Not on file  . Alcohol Use: 0.6 oz/week    1 Cans of beer per week     Comment: occ    Review of Systems  Constitutional: Negative for fever.       10 Systems reviewed and are negative for acute change except as noted in the HPI.  HENT: Negative for congestion.   Eyes: Negative for discharge and redness.  Respiratory: Negative for cough and shortness of breath.   Cardiovascular: Negative for chest pain.  Gastrointestinal: Positive for abdominal pain. Negative for vomiting.  Musculoskeletal: Negative for back pain.  Skin: Negative for rash.  Neurological: Negative for syncope, numbness and headaches.  Psychiatric/Behavioral:       No behavior change.    Allergies  Septra and Ceclor  Home Medications  No current outpatient prescriptions on file. BP 143/74  Pulse 80  Temp(Src) 98.6 F (37 C) (Oral)  Resp 20  Ht 6\' 2"  (1.88 m)  Wt 230 lb (104.327 kg)  BMI 29.52 kg/m2  SpO2 97% Physical Exam  Nursing note and vitals reviewed. Constitutional: He appears well-developed and  well-nourished.  Awake, alert, nontoxic appearance.  HENT:  Head: Normocephalic and atraumatic.  Eyes: EOM are normal. Pupils are equal, round, and reactive to light.  Neck: Neck supple.  Cardiovascular: Normal rate and intact distal pulses.   Pulmonary/Chest: Effort normal and breath sounds normal. He exhibits no tenderness.  Abdominal: Soft. There is no tenderness. There is no rebound.  Epigastric tenderness to palpation  Genitourinary:  Genital  exam performed with pt permission and male ED Tech chaparone present during exam.  Pt examined laying.  No perineal erythema.  No penile lesions or drainage.  No scrotal erythema, edema or tenderness to palp.  Genital swab obtained  Musculoskeletal: He exhibits no tenderness.  Baseline ROM, no obvious new focal weakness.  Neurological:  Mental status and motor strength appears baseline for patient and situation.  Skin: No rash noted.  Psychiatric: He has a normal mood and affect.    ED Course  Procedures (including critical care time) Results for orders placed during the hospital encounter of 12/02/12  CBC WITH DIFFERENTIAL      Result Value Range   WBC 7.5  4.0 - 10.5 K/uL   RBC 4.81  4.22 - 5.81 MIL/uL   Hemoglobin 14.1  13.0 - 17.0 g/dL   HCT 40.9  81.1 - 91.4 %   MCV 85.7  78.0 - 100.0 fL   MCH 29.3  26.0 - 34.0 pg   MCHC 34.2  30.0 - 36.0 g/dL   RDW 13.5  11.5 - 15.5 %   Platelets 195  150 - 400 K/uL   Neutrophils Relative % 62  43 - 77 %   Neutro Abs 4.7  1.7 - 7.7 K/uL   Lymphocytes Relative 26  12 - 46 %   Lymphs Abs 2.0  0.7 - 4.0 K/uL   Monocytes Relative 10  3 - 12 %   Monocytes Absolute 0.7  0.1 - 1.0 K/uL   Eosinophils Relative 2  0 - 5 %   Eosinophils Absolute 0.1  0.0 - 0.7 K/uL   Basophils Relative 0  0 - 1 %   Basophils Absolute 0.0  0.0 - 0.1 K/uL  COMPREHENSIVE METABOLIC PANEL      Result Value Range   Sodium 137  135 - 145 mEq/L   Potassium 3.0 (*) 3.5 - 5.1 mEq/L   Chloride 102  96 - 112 mEq/L   CO2 28   19 - 32 mEq/L   Glucose, Bld 108 (*) 70 - 99 mg/dL   BUN 14  6 - 23 mg/dL   Creatinine, Ser 1.61  0.50 - 1.35 mg/dL   Calcium 9.2  8.4 - 09.6 mg/dL   Total Protein 6.6  6.0 - 8.3 g/dL   Albumin 4.2  3.5 - 5.2 g/dL   AST 16  0 - 37 U/L   ALT 12  0 - 53 U/L   Alkaline Phosphatase 69  39 - 117 U/L   Total Bilirubin 0.4  0.3 - 1.2 mg/dL   GFR calc non Af Amer >90  >90 mL/min   GFR calc Af Amer >90  >90 mL/min  LIPASE, BLOOD      Result Value Range   Lipase 22  11 - 59 U/L   Medications  0.9 %  sodium chloride infusion ( Intravenous New Bag/Given 12/02/12 0351)  pantoprazole (PROTONIX) injection 40 mg (40 mg Intravenous Given 12/02/12 0352)  ondansetron (ZOFRAN) injection 4 mg (4 mg Intravenous Given 12/02/12 0354)  morphine 4 MG/ML injection 2 mg (2 mg Intravenous Given 12/02/12 0354)  potassium chloride SA (K-DUR,KLOR-CON) CR tablet 60 mEq (60 mEq Oral Given 12/02/12 0458)    MDM  Patient with abdominal pain. Given protonix, zofran, and morphine with relief. Labs with low potassium. Given potassium. Genital swab probe obtained and is pending. Pt stable in ED with no significant deterioration in condition.The patient appears reasonably screened and/or stabilized for discharge and I doubt any other medical condition or other [facility] requiring further screening, evaluation, or treatment in the ED at this time prior to discharge.  MDM Reviewed: nursing note and vitals Interpretation: labs     Nicoletta Dress. Colon Branch, MD 12/02/12 0500

## 2012-12-02 NOTE — ED Notes (Signed)
Abdominal pain, upper abdomen per pt. Feels like food is coming back up in my throat after I eat per pt. Has been nauseated, denies vomiting.

## 2012-12-03 LAB — GC/CHLAMYDIA PROBE AMP
CT Probe RNA: NEGATIVE
GC Probe RNA: NEGATIVE

## 2013-09-09 ENCOUNTER — Emergency Department (HOSPITAL_COMMUNITY)
Admission: EM | Admit: 2013-09-09 | Discharge: 2013-09-09 | Disposition: A | Discharge status: Home / Self Care | Payer: Self-pay | Attending: Emergency Medicine | Admitting: Emergency Medicine

## 2013-09-09 ENCOUNTER — Encounter (HOSPITAL_COMMUNITY): Payer: Self-pay | Admitting: Emergency Medicine

## 2013-09-09 DIAGNOSIS — H5710 Ocular pain, unspecified eye: Secondary | ICD-10-CM | POA: Insufficient documentation

## 2013-09-09 DIAGNOSIS — R221 Localized swelling, mass and lump, neck: Secondary | ICD-10-CM

## 2013-09-09 DIAGNOSIS — R21 Rash and other nonspecific skin eruption: Secondary | ICD-10-CM | POA: Insufficient documentation

## 2013-09-09 DIAGNOSIS — F17200 Nicotine dependence, unspecified, uncomplicated: Secondary | ICD-10-CM | POA: Insufficient documentation

## 2013-09-09 DIAGNOSIS — R51 Headache: Secondary | ICD-10-CM | POA: Insufficient documentation

## 2013-09-09 DIAGNOSIS — L089 Local infection of the skin and subcutaneous tissue, unspecified: Secondary | ICD-10-CM | POA: Insufficient documentation

## 2013-09-09 DIAGNOSIS — H5789 Other specified disorders of eye and adnexa: Secondary | ICD-10-CM | POA: Insufficient documentation

## 2013-09-09 DIAGNOSIS — R220 Localized swelling, mass and lump, head: Secondary | ICD-10-CM | POA: Insufficient documentation

## 2013-09-09 DIAGNOSIS — R599 Enlarged lymph nodes, unspecified: Secondary | ICD-10-CM | POA: Insufficient documentation

## 2013-09-09 DIAGNOSIS — Z8659 Personal history of other mental and behavioral disorders: Secondary | ICD-10-CM | POA: Insufficient documentation

## 2013-09-09 DIAGNOSIS — Z87828 Personal history of other (healed) physical injury and trauma: Secondary | ICD-10-CM | POA: Insufficient documentation

## 2013-09-09 MED ORDER — ACYCLOVIR 400 MG PO TABS: 400.0000 mg | ORAL_TABLET | Freq: Every day | ORAL | Status: DC

## 2013-09-09 MED ORDER — TRAMADOL HCL 50 MG PO TABS: 50.0000 mg | ORAL_TABLET | Freq: Four times a day (QID) | ORAL | Status: DC | PRN

## 2013-09-09 MED ORDER — DOXYCYCLINE HYCLATE 100 MG PO CAPS: 100.0000 mg | ORAL_CAPSULE | Freq: Two times a day (BID) | ORAL | Status: DC

## 2013-09-09 MED ORDER — SULFAMETHOXAZOLE-TRIMETHOPRIM 800-160 MG PO TABS: 1.0000 | ORAL_TABLET | Freq: Two times a day (BID) | ORAL | Status: DC

## 2013-09-09 NOTE — ED Provider Notes (Signed)
CSN: [id number]     Arrival date & time 09/09/13  [id number] History  This chart was scribed for Benny LennertJoseph L Emerald Shor, MD by Beverly MilchJ Harrison Collins, ED Scribe. This patient was seen in room APA04/APA04 and the patient's care was started at 7:09 AM.    Chief Complaint  Patient presents with  . Jaw Pain  . Facial Swelling      Patient is a 28 y.o. male presenting with facial injury.  Facial Injury Mechanism of injury:  Unable to specify Location:  R cheek (right jaw and eye) Pain details:    Quality:  Aching and throbbing   Duration:  4 days   Timing:  Constant   Progression:  Worsening Chronicity:  New Foreign body present:  No foreign bodies Relieved by:  Nothing Worsened by:  Movement and pressure Associated symptoms: headaches (radiating to right side of head.)   Associated symptoms: no congestion and no double vision   Pt reports he initially noticed a small know on his right jaw Thursday evening 4 days ago. He reports the next day his right eye began to swell and become red. Pt denies a regular PCP.  Past Medical History  Diagnosis Date  . ADHD (attention deficit hyperactivity disorder)   . Head trauma   . Bipolar 1 disorder    History reviewed. No pertinent past surgical history. No family history on file. History  Substance Use Topics  . Smoking status: Current Every Day Smoker    Types: Cigarettes  . Smokeless tobacco: Not on file  . Alcohol Use: 0.6 oz/week    1 Cans of beer per week     Comment: occ    Review of Systems  Constitutional: Negative for appetite change and fatigue.  HENT: Positive for facial swelling. Negative for congestion, ear discharge and sinus pressure.   Eyes: Positive for pain and redness. Negative for double vision and discharge.  Respiratory: Negative for cough.   Cardiovascular: Negative for chest pain.  Gastrointestinal: Negative for abdominal pain and diarrhea.  Genitourinary: Negative for frequency and hematuria.  Musculoskeletal: Negative  for back pain.  Skin: Negative for rash.  Neurological: Positive for headaches (radiating to right side of head.). Negative for seizures.  Psychiatric/Behavioral: Negative for hallucinations.      Allergies  Septra and Ceclor  Home Medications   Prior to Admission medications   Not on File   Triage Vitals: BP 129/66  Pulse 74  Temp(Src) 97.4 F (36.3 C) (Oral)  Resp 20  Ht 6\' 3"  (1.905 m)  Wt 220 lb (99.791 kg)  BMI 27.50 kg/m2  SpO2 96%  Physical Exam  Nursing note and vitals reviewed. Constitutional: He is oriented to person, place, and time. He appears well-developed.  HENT:  Head: Normocephalic.  Swelling and tenderness pre auricular, swollen lymph node pre auricular    Eyes: Conjunctivae and EOM are normal. No scleral icterus.  Neck: Neck supple. No thyromegaly present.  Cardiovascular: Normal rate and regular rhythm.  Exam reveals no gallop and no friction rub.   No murmur heard. Pulmonary/Chest: No stridor. He has no wheezes. He has no rales. He exhibits no tenderness.  Abdominal: He exhibits no distension. There is no tenderness. There is no rebound.  Musculoskeletal: Normal range of motion. He exhibits no edema.  Lymphadenopathy:    He has no cervical adenopathy.  Neurological: He is oriented to person, place, and time. He exhibits normal muscle tone. Coordination normal.  Skin: Rash (Rash over right eyelid with tenderness) noted.  No erythema.  Psychiatric: He has a normal mood and affect. His behavior is normal.    ED Course  Procedures (including critical care time)  DIAGNOSTIC STUDIES: Oxygen Saturation is 96% on RA, adequate by my interpretation.    COORDINATION OF CARE: 7:13 AM- Pt's parents advised of plan for treatment. Parents verbalize understanding and agreement with plan.    Labs Review Labs Reviewed - No data to display  Imaging Review No results found.   EKG Interpretation None      MDM   Final diagnoses:  None   The  chart was scribed for me under my direct supervision.  I personally performed the history, physical, and medical decision making and all procedures in the evaluation of this patient.Benny Lennert.    Steele Ledonne L Zaylynn Rickett, MD 09/09/13 [phone]

## 2013-09-09 NOTE — ED Notes (Signed)
Pt reports right sided jaw pain for the past 3 days, woke w/ right eye swelling & ear pain.

## 2013-09-09 NOTE — ED Notes (Signed)
nad noted prior to dc. Dc instructions reviewed and explained. Voiced understanding. 3 scripts given to pt.  

## 2013-09-09 NOTE — Discharge Instructions (Signed)
Call dr. Avel Sensoreoh's office to be seen this week for recheck
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
## 2013-12-08 ENCOUNTER — Encounter (HOSPITAL_COMMUNITY): Payer: Self-pay | Admitting: Emergency Medicine

## 2013-12-08 ENCOUNTER — Emergency Department (HOSPITAL_COMMUNITY)
Admission: EM | Admit: 2013-12-08 | Discharge: 2013-12-08 | Disposition: A | Discharge status: Home / Self Care | Payer: Self-pay | Attending: Emergency Medicine | Admitting: Emergency Medicine

## 2013-12-08 DIAGNOSIS — Z8659 Personal history of other mental and behavioral disorders: Secondary | ICD-10-CM | POA: Insufficient documentation

## 2013-12-08 DIAGNOSIS — R0981 Nasal congestion: Secondary | ICD-10-CM

## 2013-12-08 DIAGNOSIS — Z87828 Personal history of other (healed) physical injury and trauma: Secondary | ICD-10-CM | POA: Insufficient documentation

## 2013-12-08 DIAGNOSIS — F17200 Nicotine dependence, unspecified, uncomplicated: Secondary | ICD-10-CM | POA: Insufficient documentation

## 2013-12-08 DIAGNOSIS — R040 Epistaxis: Secondary | ICD-10-CM | POA: Insufficient documentation

## 2013-12-08 DIAGNOSIS — J3489 Other specified disorders of nose and nasal sinuses: Secondary | ICD-10-CM | POA: Insufficient documentation
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
## 2013-12-08 NOTE — ED Notes (Signed)
Pt presents to ED with nosebleeds, onset x3 days. Pt states he feels pressure behind right eye, ear and head prior to nosebleed; head pressure decreases after nosebleed. Pt denies Hx of epistaxis. Bleeding controlled at this time.

## 2013-12-09 ENCOUNTER — Emergency Department (HOSPITAL_COMMUNITY)
Admission: EM | Admit: 2013-12-09 | Discharge: 2013-12-09 | Disposition: A | Discharge status: Home / Self Care | Payer: Self-pay | Attending: Emergency Medicine | Admitting: Emergency Medicine

## 2013-12-09 ENCOUNTER — Encounter (HOSPITAL_COMMUNITY): Payer: Self-pay | Admitting: Emergency Medicine

## 2013-12-09 DIAGNOSIS — Z87828 Personal history of other (healed) physical injury and trauma: Secondary | ICD-10-CM | POA: Insufficient documentation

## 2013-12-09 DIAGNOSIS — Z8659 Personal history of other mental and behavioral disorders: Secondary | ICD-10-CM | POA: Insufficient documentation

## 2013-12-09 DIAGNOSIS — F17200 Nicotine dependence, unspecified, uncomplicated: Secondary | ICD-10-CM | POA: Insufficient documentation

## 2013-12-09 DIAGNOSIS — R040 Epistaxis: Secondary | ICD-10-CM | POA: Insufficient documentation

## 2013-12-09 DIAGNOSIS — J0100 Acute maxillary sinusitis, unspecified: Secondary | ICD-10-CM | POA: Insufficient documentation

## 2013-12-09 HISTORY — DX: Nasal polyp, unspecified: J33.9

## 2013-12-09 MED ORDER — OXYMETAZOLINE HCL 0.05 % NA SOLN
NASAL | Status: AC
  Filled 2013-12-09: qty 15

## 2013-12-09 MED ORDER — AZITHROMYCIN 250 MG PO TABS: 250.0000 mg | ORAL_TABLET | Freq: Every day | ORAL | Status: AC

## 2013-12-09 MED ORDER — BACITRACIN 500 UNIT/GM EX OINT
1.0000 "application " | TOPICAL_OINTMENT | Freq: Two times a day (BID) | CUTANEOUS | Status: DC
  Administered 2013-12-09: 1 via TOPICAL
  Filled 2013-12-09 (×4): qty 0.9

## 2013-12-09 MED ORDER — BACITRACIN ZINC 500 UNIT/GM EX OINT
TOPICAL_OINTMENT | CUTANEOUS | Status: AC
  Filled 2013-12-09: qty 3.6

## 2013-12-09 MED ORDER — NAPROXEN 500 MG PO TABS: 500.0000 mg | ORAL_TABLET | Freq: Two times a day (BID) | ORAL | Status: AC

## 2013-12-09 MED ORDER — OXYMETAZOLINE HCL 0.05 % NA SOLN
1.0000 | Freq: Once | NASAL | Status: AC
  Administered 2013-12-09: 1 via NASAL

## 2013-12-09 MED ORDER — NAPROXEN 250 MG PO TABS
500.0000 mg | ORAL_TABLET | Freq: Once | ORAL | Status: AC
  Administered 2013-12-09: 500 mg via ORAL

## 2013-12-09 MED ORDER — NAPROXEN 250 MG PO TABS
ORAL_TABLET | ORAL | Status: AC
  Filled 2013-12-09: qty 2
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
## 2013-12-09 NOTE — Discharge Instructions (Signed)
Apply an antibiotic ointment to your nostrils daily with a Q-tip, take the antibiotic called Zithromax for her sinus infection, use the Afrin nasal spray twice daily as needed for swelling and congestion in your nostrils. You must followup with the specialist listed above for further evaluation of your nose and she may have a polyp in your nostril. This may cause ongoing and recurrent or worsening symptoms if it is not checked and treated appropriately. Call today for the next available appointment.

## 2013-12-09 NOTE — ED Notes (Signed)
Pt c/o headache and facial pressure; also states reoccurring nosebleeds x 4 days.

## 2013-12-22 ENCOUNTER — Emergency Department (HOSPITAL_COMMUNITY)
Admission: EM | Admit: 2013-12-22 | Discharge: 2013-12-22 | Disposition: A | Discharge status: Home / Self Care | Payer: Self-pay | Attending: Emergency Medicine | Admitting: Emergency Medicine

## 2013-12-22 ENCOUNTER — Encounter (HOSPITAL_COMMUNITY): Payer: Self-pay | Admitting: Emergency Medicine

## 2013-12-22 DIAGNOSIS — Z8659 Personal history of other mental and behavioral disorders: Secondary | ICD-10-CM | POA: Insufficient documentation

## 2013-12-22 DIAGNOSIS — S61209A Unspecified open wound of unspecified finger without damage to nail, initial encounter: Secondary | ICD-10-CM | POA: Insufficient documentation

## 2013-12-22 DIAGNOSIS — IMO0002 Reserved for concepts with insufficient information to code with codable children: Secondary | ICD-10-CM

## 2013-12-22 DIAGNOSIS — Y9389 Activity, other specified: Secondary | ICD-10-CM | POA: Insufficient documentation

## 2013-12-22 DIAGNOSIS — W260XXA Contact with knife, initial encounter: Secondary | ICD-10-CM | POA: Insufficient documentation

## 2013-12-22 DIAGNOSIS — Z792 Long term (current) use of antibiotics: Secondary | ICD-10-CM | POA: Insufficient documentation

## 2013-12-22 DIAGNOSIS — F17200 Nicotine dependence, unspecified, uncomplicated: Secondary | ICD-10-CM | POA: Insufficient documentation

## 2013-12-22 DIAGNOSIS — Z87828 Personal history of other (healed) physical injury and trauma: Secondary | ICD-10-CM | POA: Insufficient documentation

## 2013-12-22 DIAGNOSIS — Y9289 Other specified places as the place of occurrence of the external cause: Secondary | ICD-10-CM | POA: Insufficient documentation

## 2013-12-22 DIAGNOSIS — Z791 Long term (current) use of non-steroidal anti-inflammatories (NSAID): Secondary | ICD-10-CM | POA: Insufficient documentation

## 2013-12-22 DIAGNOSIS — W261XXA Contact with sword or dagger, initial encounter: Secondary | ICD-10-CM

## 2013-12-22 DIAGNOSIS — Z8709 Personal history of other diseases of the respiratory system: Secondary | ICD-10-CM | POA: Insufficient documentation

## 2013-12-22 MED ORDER — LIDOCAINE HCL (PF) 2 % IJ SOLN
2.0000 mL | Freq: Once | INTRAMUSCULAR | Status: AC
  Administered 2013-12-22: 2 mL
  Filled 2013-12-22: qty 10

## 2013-12-22 NOTE — ED Notes (Signed)
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

## 2013-12-22 NOTE — Discharge Instructions (Signed)
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
## 2013-12-23 NOTE — ED Provider Notes (Signed)
Medical screening examination/treatment/procedure(s) were performed by non-physician practitioner and as supervising physician I was immediately available for consultation/collaboration.   EKG Interpretation None       Micheal HornJohn M Layman Gully, MD 12/23/13 1153

## 2014-05-09 ENCOUNTER — Inpatient Hospital Stay (HOSPITAL_COMMUNITY)
Admission: EM | Admit: 2014-05-09 | Discharge: 2014-05-14 | DRG: 164 | Disposition: A | Discharge status: Home / Self Care | Payer: No Typology Code available for payment source | Attending: Thoracic Surgery (Cardiothoracic Vascular Surgery) | Admitting: Thoracic Surgery (Cardiothoracic Vascular Surgery)

## 2014-05-09 DIAGNOSIS — Z4682 Encounter for fitting and adjustment of non-vascular catheter: Secondary | ICD-10-CM

## 2014-05-09 DIAGNOSIS — S272XXA Traumatic hemopneumothorax, initial encounter: Secondary | ICD-10-CM | POA: Diagnosis not present

## 2014-05-09 DIAGNOSIS — S21122A Laceration with foreign body of left front wall of thorax without penetration into thoracic cavity, initial encounter: Secondary | ICD-10-CM | POA: Diagnosis present

## 2014-05-09 DIAGNOSIS — S21112A Laceration without foreign body of left front wall of thorax without penetration into thoracic cavity, initial encounter: Secondary | ICD-10-CM | POA: Diagnosis present

## 2014-05-09 DIAGNOSIS — D62 Acute posthemorrhagic anemia: Secondary | ICD-10-CM | POA: Diagnosis not present

## 2014-05-09 DIAGNOSIS — F17210 Nicotine dependence, cigarettes, uncomplicated: Secondary | ICD-10-CM | POA: Diagnosis present

## 2014-05-09 DIAGNOSIS — T148 Other injury of unspecified body region: Secondary | ICD-10-CM | POA: Diagnosis not present

## 2014-05-09 DIAGNOSIS — T148XXA Other injury of unspecified body region, initial encounter: Secondary | ICD-10-CM

## 2014-05-09 DIAGNOSIS — J939 Pneumothorax, unspecified: Secondary | ICD-10-CM

## 2014-05-09 DIAGNOSIS — Z23 Encounter for immunization: Secondary | ICD-10-CM

## 2014-05-09 DIAGNOSIS — K5900 Constipation, unspecified: Secondary | ICD-10-CM | POA: Diagnosis not present

## 2014-05-09 DIAGNOSIS — J9382 Other air leak: Secondary | ICD-10-CM | POA: Diagnosis present

## 2014-05-09 DIAGNOSIS — S20359A Superficial foreign body of unspecified front wall of thorax, initial encounter: Secondary | ICD-10-CM | POA: Diagnosis present

## 2014-05-09 HISTORY — DX: Laceration without foreign body of left front wall of thorax without penetration into thoracic cavity, initial encounter: S21.112A
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
## 2014-05-10 ENCOUNTER — Encounter (HOSPITAL_COMMUNITY): Payer: Self-pay | Admitting: *Deleted

## 2014-05-10 ENCOUNTER — Emergency Department (HOSPITAL_COMMUNITY): Payer: No Typology Code available for payment source | Admitting: Anesthesiology

## 2014-05-10 ENCOUNTER — Encounter (HOSPITAL_COMMUNITY): Admission: EM | Disposition: A | Discharge status: Home / Self Care | Payer: Self-pay | Source: Home / Self Care

## 2014-05-10 ENCOUNTER — Inpatient Hospital Stay (HOSPITAL_COMMUNITY): Payer: No Typology Code available for payment source

## 2014-05-10 ENCOUNTER — Emergency Department (HOSPITAL_COMMUNITY): Payer: No Typology Code available for payment source

## 2014-05-10 DIAGNOSIS — T17808A Unspecified foreign body in other parts of respiratory tract causing other injury, initial encounter: Secondary | ICD-10-CM

## 2014-05-10 DIAGNOSIS — S272XXA Traumatic hemopneumothorax, initial encounter: Secondary | ICD-10-CM | POA: Diagnosis present

## 2014-05-10 DIAGNOSIS — F17210 Nicotine dependence, cigarettes, uncomplicated: Secondary | ICD-10-CM | POA: Diagnosis present

## 2014-05-10 DIAGNOSIS — D62 Acute posthemorrhagic anemia: Secondary | ICD-10-CM | POA: Diagnosis not present

## 2014-05-10 DIAGNOSIS — J9382 Other air leak: Secondary | ICD-10-CM | POA: Diagnosis present

## 2014-05-10 DIAGNOSIS — Z23 Encounter for immunization: Secondary | ICD-10-CM | POA: Diagnosis not present

## 2014-05-10 DIAGNOSIS — T148 Other injury of unspecified body region: Secondary | ICD-10-CM | POA: Diagnosis present

## 2014-05-10 DIAGNOSIS — S21112A Laceration without foreign body of left front wall of thorax without penetration into thoracic cavity, initial encounter: Secondary | ICD-10-CM | POA: Diagnosis present

## 2014-05-10 DIAGNOSIS — K5900 Constipation, unspecified: Secondary | ICD-10-CM | POA: Diagnosis not present

## 2014-05-10 DIAGNOSIS — S21122A Laceration with foreign body of left front wall of thorax without penetration into thoracic cavity, initial encounter: Secondary | ICD-10-CM | POA: Diagnosis present

## 2014-05-10 HISTORY — DX: Laceration without foreign body of left front wall of thorax without penetration into thoracic cavity, initial encounter: S21.112A

## 2014-05-10 HISTORY — PX: OTHER SURGICAL HISTORY: SHX169

## 2014-05-10 HISTORY — PX: VIDEO ASSISTED THORACOSCOPY (VATS)/THOROCOTOMY: SHX6173

## 2014-05-10 LAB — POCT I-STAT 3, ART BLOOD GAS (G3+)
Acid-base deficit: 5 mmol/L — ABNORMAL HIGH (ref 0.0–2.0)
BICARBONATE: 21 meq/L (ref 20.0–24.0)
O2 Saturation: 94 %
PO2 ART: 77 mmHg — AB (ref 80.0–100.0)
Patient temperature: 98.1
TCO2: 22 mmol/L (ref 0–100)
pCO2 arterial: 40.3 mmHg (ref 35.0–45.0)
pH, Arterial: 7.323 — ABNORMAL LOW (ref 7.350–7.450)

## 2014-05-10 LAB — CBC
HCT: 35.3 % — ABNORMAL LOW (ref 39.0–52.0)
HCT: 42.1 % (ref 39.0–52.0)
Hemoglobin: 11.5 g/dL — ABNORMAL LOW (ref 13.0–17.0)
Hemoglobin: 14.1 g/dL (ref 13.0–17.0)
MCH: 28.3 pg (ref 26.0–34.0)
MCH: 29.3 pg (ref 26.0–34.0)
MCHC: 32.6 g/dL (ref 30.0–36.0)
MCHC: 33.5 g/dL (ref 30.0–36.0)
MCV: 86.7 fL (ref 78.0–100.0)
MCV: 87.3 fL (ref 78.0–100.0)
PLATELETS: 170 10*3/uL (ref 150–400)
Platelets: 282 10*3/uL (ref 150–400)
RBC: 4.07 MIL/uL — ABNORMAL LOW (ref 4.22–5.81)
RBC: 4.82 MIL/uL (ref 4.22–5.81)
RDW: 13.5 % (ref 11.5–15.5)
RDW: 13.6 % (ref 11.5–15.5)
WBC: 10.3 10*3/uL (ref 4.0–10.5)
WBC: 13.3 10*3/uL — ABNORMAL HIGH (ref 4.0–10.5)

## 2014-05-10 LAB — I-STAT CG4 LACTIC ACID, ED: LACTIC ACID, VENOUS: 2.51 mmol/L — AB (ref 0.5–2.2)

## 2014-05-10 LAB — GLUCOSE, CAPILLARY
GLUCOSE-CAPILLARY: 119 mg/dL — AB (ref 70–99)
GLUCOSE-CAPILLARY: 148 mg/dL — AB (ref 70–99)
Glucose-Capillary: 149 mg/dL — ABNORMAL HIGH (ref 70–99)

## 2014-05-10 LAB — I-STAT CHEM 8, ED
BUN: 12 mg/dL (ref 6–23)
CALCIUM ION: 1.22 mmol/L (ref 1.12–1.23)
CHLORIDE: 106 meq/L (ref 96–112)
CREATININE: 1 mg/dL (ref 0.50–1.35)
Glucose, Bld: 118 mg/dL — ABNORMAL HIGH (ref 70–99)
HCT: 43 % (ref 39.0–52.0)
Hemoglobin: 14.6 g/dL (ref 13.0–17.0)
Potassium: 3.9 mEq/L (ref 3.7–5.3)
SODIUM: 142 meq/L (ref 137–147)
TCO2: 24 mmol/L (ref 0–100)

## 2014-05-10 LAB — BASIC METABOLIC PANEL
Anion gap: 9 (ref 5–15)
BUN: 7 mg/dL (ref 6–23)
CALCIUM: 8.3 mg/dL — AB (ref 8.4–10.5)
CO2: 25 mEq/L (ref 19–32)
CREATININE: 0.67 mg/dL (ref 0.50–1.35)
Chloride: 103 mEq/L (ref 96–112)
GFR calc Af Amer: >90 mL/min (ref >90)
Glucose, Bld: 112 mg/dL — ABNORMAL HIGH (ref 70–99)
Potassium: 3.6 mEq/L — ABNORMAL LOW (ref 3.7–5.3)
SODIUM: 137 meq/L (ref 137–147)

## 2014-05-10 LAB — PREPARE FRESH FROZEN PLASMA
UNIT DIVISION: 0
Unit division: 0

## 2014-05-10 LAB — COMPREHENSIVE METABOLIC PANEL
ALK PHOS: 83 U/L (ref 39–117)
ALT: 12 U/L (ref 0–53)
AST: 18 U/L (ref 0–37)
Albumin: 3.9 g/dL (ref 3.5–5.2)
Anion gap: 12 (ref 5–15)
BUN: 11 mg/dL (ref 6–23)
CALCIUM: 9.3 mg/dL (ref 8.4–10.5)
CHLORIDE: 102 meq/L (ref 96–112)
CO2: 24 meq/L (ref 19–32)
CREATININE: 0.9 mg/dL (ref 0.50–1.35)
GFR calc Af Amer: >90 mL/min (ref >90)
GFR calc non Af Amer: >90 mL/min (ref >90)
Glucose, Bld: 116 mg/dL — ABNORMAL HIGH (ref 70–99)
POTASSIUM: 4 meq/L (ref 3.7–5.3)
Sodium: 138 mEq/L (ref 137–147)
Total Bilirubin: <0.2 mg/dL — ABNORMAL LOW (ref 0.3–1.2)
Total Protein: 6.5 g/dL (ref 6.0–8.3)

## 2014-05-10 LAB — MRSA PCR SCREENING: MRSA BY PCR: NEGATIVE

## 2014-05-10 LAB — PROTIME-INR
INR: 1.2 (ref 0.00–1.49)
PROTHROMBIN TIME: 15.4 s — AB (ref 11.6–15.2)

## 2014-05-10 LAB — ETHANOL: Alcohol, Ethyl (B): <11 mg/dL (ref 0–11)

## 2014-05-10 LAB — CDS SEROLOGY

## 2014-05-10 LAB — ABO/RH: ABO/RH(D): O POS

## 2014-05-10 SURGERY — VIDEO ASSISTED THORACOSCOPY (VATS)/THOROCOTOMY
Anesthesia: General | Site: Chest | Laterality: Left

## 2014-05-10 MED ORDER — LACTATED RINGERS IV SOLN
INTRAVENOUS | Status: DC | PRN
  Administered 2014-05-10 (×2): via INTRAVENOUS

## 2014-05-10 MED ORDER — CEFAZOLIN SODIUM-DEXTROSE 2-3 GM-% IV SOLR
2.0000 g | Freq: Once | INTRAVENOUS | Status: AC
  Administered 2014-05-10: 2 g via INTRAVENOUS
  Filled 2014-05-10: qty 50

## 2014-05-10 MED ORDER — FENTANYL 10 MCG/ML IV SOLN
INTRAVENOUS | Status: DC
  Administered 2014-05-10: 120 ug via INTRAVENOUS
  Administered 2014-05-10: 195 ug/h via INTRAVENOUS
  Administered 2014-05-10: 180 ug via INTRAVENOUS
  Administered 2014-05-10: 240 ug via INTRAVENOUS
  Administered 2014-05-10 – 2014-05-11 (×3): via INTRAVENOUS
  Administered 2014-05-11: 180 ug via INTRAVENOUS
  Administered 2014-05-11: 248 ug via INTRAVENOUS
  Administered 2014-05-11: 420 ug via INTRAVENOUS
  Administered 2014-05-11: 210 ug via INTRAVENOUS
  Administered 2014-05-11: 253 ug via INTRAVENOUS
  Administered 2014-05-12: 210 ug via INTRAVENOUS
  Administered 2014-05-12: 315 ug via INTRAVENOUS
  Administered 2014-05-12: 150 ug via INTRAVENOUS
  Administered 2014-05-12: 210 ug via INTRAVENOUS
  Administered 2014-05-12 (×2): 300 ug via INTRAVENOUS
  Administered 2014-05-13: 90 ug via INTRAVENOUS
  Administered 2014-05-13: 01:00:00 via INTRAVENOUS
  Administered 2014-05-13: 165 ug via INTRAVENOUS
  Filled 2014-05-10 (×8): qty 50

## 2014-05-10 MED ORDER — KCL IN DEXTROSE-NACL 20-5-0.9 MEQ/L-%-% IV SOLN
INTRAVENOUS | Status: DC
  Administered 2014-05-10: 125 mL/h via INTRAVENOUS
  Administered 2014-05-10: 20:00:00 via INTRAVENOUS
  Filled 2014-05-10 (×7): qty 1000

## 2014-05-10 MED ORDER — IOHEXOL 300 MG/ML  SOLN
100.0000 mL | Freq: Once | INTRAMUSCULAR | Status: AC | PRN
  Administered 2014-05-10: 100 mL via INTRAVENOUS

## 2014-05-10 MED ORDER — NALOXONE HCL 0.4 MG/ML IJ SOLN
0.4000 mg | INTRAMUSCULAR | Status: DC | PRN
  Filled 2014-05-10: qty 1

## 2014-05-10 MED ORDER — ENOXAPARIN SODIUM 40 MG/0.4ML ~~LOC~~ SOLN
40.0000 mg | SUBCUTANEOUS | Status: DC
  Administered 2014-05-10 – 2014-05-13 (×4): 40 mg via SUBCUTANEOUS
  Filled 2014-05-10 (×5): qty 0.4

## 2014-05-10 MED ORDER — PROPOFOL 10 MG/ML IV BOLUS
INTRAVENOUS | Status: AC
  Filled 2014-05-10: qty 20

## 2014-05-10 MED ORDER — ROCURONIUM BROMIDE 100 MG/10ML IV SOLN
INTRAVENOUS | Status: DC | PRN
  Administered 2014-05-10: 50 mg via INTRAVENOUS
  Administered 2014-05-10: 10 mg via INTRAVENOUS

## 2014-05-10 MED ORDER — SODIUM CHLORIDE 0.9 % IV SOLN
INTRAVENOUS | Status: DC | PRN
  Administered 2014-05-10: 02:00:00 via INTRAVENOUS

## 2014-05-10 MED ORDER — HYDROMORPHONE 0.3 MG/ML IV SOLN
INTRAVENOUS | Status: AC
  Filled 2014-05-10: qty 25

## 2014-05-10 MED ORDER — NEOSTIGMINE METHYLSULFATE 10 MG/10ML IV SOLN
INTRAVENOUS | Status: DC | PRN
  Administered 2014-05-10: 4 mg via INTRAVENOUS

## 2014-05-10 MED ORDER — GLYCOPYRROLATE 0.2 MG/ML IJ SOLN
INTRAMUSCULAR | Status: DC | PRN
  Administered 2014-05-10: 0.6 mg via INTRAVENOUS

## 2014-05-10 MED ORDER — SODIUM CHLORIDE 0.9 % IV SOLN: Freq: Once | INTRAVENOUS | Status: DC

## 2014-05-10 MED ORDER — FENTANYL CITRATE 0.05 MG/ML IJ SOLN
INTRAMUSCULAR | Status: AC
  Filled 2014-05-10: qty 5

## 2014-05-10 MED ORDER — DIPHENHYDRAMINE HCL 12.5 MG/5ML PO ELIX
12.5000 mg | ORAL_SOLUTION | Freq: Four times a day (QID) | ORAL | Status: DC | PRN
  Filled 2014-05-10: qty 5

## 2014-05-10 MED ORDER — BUPIVACAINE HCL (PF) 0.5 % IJ SOLN
INTRAMUSCULAR | Status: DC | PRN
  Administered 2014-05-10: 10 mL

## 2014-05-10 MED ORDER — SUCCINYLCHOLINE CHLORIDE 20 MG/ML IJ SOLN
INTRAMUSCULAR | Status: DC | PRN
  Administered 2014-05-10: 120 mg via INTRAVENOUS

## 2014-05-10 MED ORDER — PANTOPRAZOLE SODIUM 40 MG PO TBEC
40.0000 mg | DELAYED_RELEASE_TABLET | Freq: Every day | ORAL | Status: DC
  Administered 2014-05-10 – 2014-05-14 (×5): 40 mg via ORAL
  Filled 2014-05-10 (×6): qty 1

## 2014-05-10 MED ORDER — CEFAZOLIN SODIUM 1-5 GM-% IV SOLN: 1.0000 g | Freq: Once | INTRAVENOUS | Status: DC

## 2014-05-10 MED ORDER — ONDANSETRON HCL 4 MG/2ML IJ SOLN: 4.0000 mg | Freq: Four times a day (QID) | INTRAMUSCULAR | Status: DC | PRN

## 2014-05-10 MED ORDER — SODIUM CHLORIDE 0.9 % IJ SOLN: 9.0000 mL | INTRAMUSCULAR | Status: DC | PRN

## 2014-05-10 MED ORDER — SODIUM CHLORIDE 0.9 % IV BOLUS (SEPSIS)
1000.0000 mL | Freq: Once | INTRAVENOUS | Status: AC
Start: 2014-05-10 — End: 2014-05-10
  Administered 2014-05-10: 1000 mL via INTRAVENOUS

## 2014-05-10 MED ORDER — ONDANSETRON HCL 4 MG/2ML IJ SOLN
INTRAMUSCULAR | Status: DC | PRN
  Administered 2014-05-10: 4 mg via INTRAVENOUS

## 2014-05-10 MED ORDER — BISACODYL 5 MG PO TBEC
10.0000 mg | DELAYED_RELEASE_TABLET | Freq: Every day | ORAL | Status: DC
  Administered 2014-05-10 – 2014-05-13 (×4): 10 mg via ORAL
  Filled 2014-05-10 (×5): qty 2

## 2014-05-10 MED ORDER — TRAMADOL HCL 50 MG PO TABS: 50.0000 mg | ORAL_TABLET | Freq: Four times a day (QID) | ORAL | Status: DC | PRN

## 2014-05-10 MED ORDER — OXYCODONE HCL 5 MG PO TABS
5.0000 mg | ORAL_TABLET | ORAL | Status: DC | PRN
  Administered 2014-05-11: 10 mg via ORAL
  Administered 2014-05-11 (×4): 5 mg via ORAL
  Administered 2014-05-11 – 2014-05-14 (×11): 10 mg via ORAL
  Filled 2014-05-10: qty 2
  Filled 2014-05-10: qty 1
  Filled 2014-05-10 (×3): qty 2
  Filled 2014-05-10: qty 1
  Filled 2014-05-10 (×3): qty 2
  Filled 2014-05-10 (×2): qty 1
  Filled 2014-05-10 (×5): qty 2

## 2014-05-10 MED ORDER — INSULIN ASPART 100 UNIT/ML ~~LOC~~ SOLN
0.0000 [IU] | Freq: Four times a day (QID) | SUBCUTANEOUS | Status: DC
  Administered 2014-05-10 – 2014-05-11 (×2): 2 [IU] via SUBCUTANEOUS
  Administered 2014-05-11: 4 [IU] via SUBCUTANEOUS
  Administered 2014-05-12 (×3): 2 [IU] via SUBCUTANEOUS

## 2014-05-10 MED ORDER — FENTANYL CITRATE 0.05 MG/ML IJ SOLN
150.0000 ug | Freq: Once | INTRAMUSCULAR | Status: AC
Start: 2014-05-10 — End: 2014-05-10
  Administered 2014-05-10: 150 ug via INTRAVENOUS

## 2014-05-10 MED ORDER — POTASSIUM CHLORIDE 10 MEQ/50ML IV SOLN
10.0000 meq | Freq: Every day | INTRAVENOUS | Status: DC | PRN
  Filled 2014-05-10: qty 50

## 2014-05-10 MED ORDER — SENNOSIDES-DOCUSATE SODIUM 8.6-50 MG PO TABS
1.0000 | ORAL_TABLET | Freq: Every day | ORAL | Status: DC
  Administered 2014-05-11 – 2014-05-13 (×3): 1 via ORAL
  Filled 2014-05-10 (×5): qty 1

## 2014-05-10 MED ORDER — ONDANSETRON HCL 4 MG/2ML IJ SOLN: 4.0000 mg | Freq: Once | INTRAMUSCULAR | Status: DC | PRN

## 2014-05-10 MED ORDER — ACETAMINOPHEN 500 MG PO TABS
1000.0000 mg | ORAL_TABLET | Freq: Four times a day (QID) | ORAL | Status: DC
  Administered 2014-05-10 – 2014-05-14 (×14): 1000 mg via ORAL
  Filled 2014-05-10 (×23): qty 2

## 2014-05-10 MED ORDER — FENTANYL CITRATE 0.05 MG/ML IJ SOLN
INTRAMUSCULAR | Status: AC
  Administered 2014-05-10: 150 ug via INTRAVENOUS
  Filled 2014-05-10: qty 4

## 2014-05-10 MED ORDER — CETYLPYRIDINIUM CHLORIDE 0.05 % MT LIQD
7.0000 mL | Freq: Two times a day (BID) | OROMUCOSAL | Status: DC
  Administered 2014-05-10 – 2014-05-13 (×5): 7 mL via OROMUCOSAL

## 2014-05-10 MED ORDER — VANCOMYCIN HCL IN DEXTROSE 1-5 GM/200ML-% IV SOLN
1000.0000 mg | Freq: Two times a day (BID) | INTRAVENOUS | Status: AC
  Administered 2014-05-10: 1000 mg via INTRAVENOUS
  Filled 2014-05-10: qty 200

## 2014-05-10 MED ORDER — PROPOFOL 10 MG/ML IV BOLUS
INTRAVENOUS | Status: DC | PRN
  Administered 2014-05-10: 200 mg via INTRAVENOUS

## 2014-05-10 MED ORDER — KETOROLAC TROMETHAMINE 30 MG/ML IJ SOLN
30.0000 mg | Freq: Four times a day (QID) | INTRAMUSCULAR | Status: AC
  Administered 2014-05-10 (×4): 30 mg via INTRAVENOUS
  Filled 2014-05-10 (×6): qty 1

## 2014-05-10 MED ORDER — FENTANYL CITRATE 0.05 MG/ML IJ SOLN
INTRAMUSCULAR | Status: DC | PRN
  Administered 2014-05-10: 125 ug via INTRAVENOUS
  Administered 2014-05-10: 100 ug via INTRAVENOUS
  Administered 2014-05-10: 150 ug via INTRAVENOUS
  Administered 2014-05-10: 50 ug via INTRAVENOUS
  Administered 2014-05-10: 75 ug via INTRAVENOUS

## 2014-05-10 MED ORDER — BUPIVACAINE ON-Q PAIN PUMP (FOR ORDER SET NO CHG): INJECTION | Status: AC

## 2014-05-10 MED ORDER — HYDROMORPHONE HCL 1 MG/ML IJ SOLN
INTRAMUSCULAR | Status: AC
  Filled 2014-05-10: qty 1

## 2014-05-10 MED ORDER — BUPIVACAINE 0.5 % ON-Q PUMP SINGLE CATH 400 ML
400.0000 mL | INJECTION | Status: DC
  Administered 2014-05-10: 400 mL
  Filled 2014-05-10: qty 400

## 2014-05-10 MED ORDER — ACETAMINOPHEN 160 MG/5ML PO SOLN
1000.0000 mg | Freq: Four times a day (QID) | ORAL | Status: DC
  Filled 2014-05-10: qty 40

## 2014-05-10 MED ORDER — DIPHENHYDRAMINE HCL 50 MG/ML IJ SOLN
12.5000 mg | Freq: Four times a day (QID) | INTRAMUSCULAR | Status: DC | PRN
  Filled 2014-05-10: qty 0.25

## 2014-05-10 MED ORDER — KETOROLAC TROMETHAMINE 30 MG/ML IJ SOLN
INTRAMUSCULAR | Status: AC
  Administered 2014-05-10: 30 mg via INTRAVENOUS
  Filled 2014-05-10: qty 1

## 2014-05-10 MED ORDER — BUPIVACAINE HCL (PF) 0.5 % IJ SOLN
INTRAMUSCULAR | Status: AC
  Filled 2014-05-10: qty 10

## 2014-05-10 MED ORDER — 0.9 % SODIUM CHLORIDE (POUR BTL) OPTIME
TOPICAL | Status: DC | PRN
  Administered 2014-05-10: 1000 mL

## 2014-05-10 MED ORDER — HYDROMORPHONE HCL 1 MG/ML IJ SOLN
0.2500 mg | INTRAMUSCULAR | Status: DC | PRN
  Administered 2014-05-10 (×4): 0.5 mg via INTRAVENOUS

## 2014-05-10 MED ORDER — ONDANSETRON HCL 4 MG/2ML IJ SOLN
4.0000 mg | Freq: Four times a day (QID) | INTRAMUSCULAR | Status: DC | PRN
  Filled 2014-05-10: qty 2

## 2014-05-10 SURGICAL SUPPLY — 101 items
ADH SKN CLS APL DERMABOND .7 (GAUZE/BANDAGES/DRESSINGS)
APPLIER CLIP ROT 10 11.4 M/L (STAPLE)
APR CLP MED LRG 11.4X10 (STAPLE)
BAG SPEC RTRVL LRG 6X4 10 (ENDOMECHANICALS)
BLADE SURG 11 STRL SS (BLADE) ×2 IMPLANT
CANISTER SUCTION 2500CC (MISCELLANEOUS) ×1 IMPLANT
CATH KIT ON Q 5IN SLV (PAIN MANAGEMENT) ×2 IMPLANT
CATH THORACIC 28FR (CATHETERS) IMPLANT
CATH THORACIC 28FR RT ANG (CATHETERS) IMPLANT
CATH THORACIC 36FR (CATHETERS) IMPLANT
CATH THORACIC 36FR RT ANG (CATHETERS) IMPLANT
CLIP APPLIE ROT 10 11.4 M/L (STAPLE) IMPLANT
CLIP TI MEDIUM 6 (CLIP) ×3 IMPLANT
CLIP TI WIDE RED SMALL 24 (CLIP) ×2 IMPLANT
CONN ST 1/4X3/8  BEN (MISCELLANEOUS) ×2
CONN ST 1/4X3/8 BEN (MISCELLANEOUS) IMPLANT
CONN Y 3/8X3/8X3/8  BEN (MISCELLANEOUS)
CONN Y 3/8X3/8X3/8 BEN (MISCELLANEOUS) IMPLANT
CONT SPEC 4OZ CLIKSEAL STRL BL (MISCELLANEOUS) ×2 IMPLANT
DERMABOND ADVANCED (GAUZE/BANDAGES/DRESSINGS)
DERMABOND ADVANCED .7 DNX12 (GAUZE/BANDAGES/DRESSINGS) IMPLANT
DRAPE INCISE IOBAN 66X45 STRL (DRAPES) ×2 IMPLANT
DRAPE LAPAROSCOPIC ABDOMINAL (DRAPES) ×1 IMPLANT
DRAPE PROXIMA HALF (DRAPES) ×4 IMPLANT
DRAPE WARM FLUID 44X44 (DRAPE) ×3 IMPLANT
ELECT BLADE 6.5 EXT (BLADE) ×1 IMPLANT
ELECT REM PT RETURN 9FT ADLT (ELECTROSURGICAL) ×3
ELECTRODE REM PT RTRN 9FT ADLT (ELECTROSURGICAL) ×1 IMPLANT
GAUZE SPONGE 4X4 12PLY STRL (GAUZE/BANDAGES/DRESSINGS) ×1 IMPLANT
GLOVE BIOGEL M 6.5 STRL (GLOVE) ×2 IMPLANT
GLOVE BIOGEL PI IND STRL 6 (GLOVE) IMPLANT
GLOVE BIOGEL PI IND STRL 6.5 (GLOVE) IMPLANT
GLOVE BIOGEL PI IND STRL 7.0 (GLOVE) IMPLANT
GLOVE BIOGEL PI INDICATOR 6 (GLOVE) ×2
GLOVE BIOGEL PI INDICATOR 6.5 (GLOVE) ×2
GLOVE BIOGEL PI INDICATOR 7.0 (GLOVE) ×4
GLOVE SURG SIGNA 7.5 PF LTX (GLOVE) ×4 IMPLANT
GOWN STRL REUS W/ TWL LRG LVL3 (GOWN DISPOSABLE) ×2 IMPLANT
GOWN STRL REUS W/ TWL XL LVL3 (GOWN DISPOSABLE) ×1 IMPLANT
GOWN STRL REUS W/TWL LRG LVL3 (GOWN DISPOSABLE) ×3
GOWN STRL REUS W/TWL XL LVL3 (GOWN DISPOSABLE) ×3
HANDLE STAPLE ENDO GIA SHORT (STAPLE) ×2
HEMOSTAT SURGICEL 2X14 (HEMOSTASIS) IMPLANT
KIT BASIN OR (CUSTOM PROCEDURE TRAY) ×3 IMPLANT
KIT ROOM TURNOVER OR (KITS) ×1 IMPLANT
KIT SUCTION CATH 14FR (SUCTIONS) ×1 IMPLANT
NS IRRIG 1000ML POUR BTL (IV SOLUTION) ×6 IMPLANT
PACK CHEST (CUSTOM PROCEDURE TRAY) ×3 IMPLANT
PAD ARMBOARD 7.5X6 YLW CONV (MISCELLANEOUS) ×2 IMPLANT
POUCH ENDO CATCH II 15MM (MISCELLANEOUS) IMPLANT
POUCH SPECIMEN RETRIEVAL 10MM (ENDOMECHANICALS) IMPLANT
RELOAD EGIA 45 MED/THCK PURPLE (STAPLE) ×2 IMPLANT
RELOAD EGIA 45 TAN VASC (STAPLE) ×2 IMPLANT
RELOAD EGIA 60 MED/THCK PURPLE (STAPLE) ×12 IMPLANT
RELOAD EGIA TRIS TAN 45 CVD (STAPLE) ×6 IMPLANT
RELOAD GIA II 30 3.5 (ENDOMECHANICALS) ×2 IMPLANT
RELOAD STAPLE 45 TAN MED CVD (STAPLE) IMPLANT
RELOAD STAPLE 60 MED/THCK ART (STAPLE) IMPLANT
SCISSORS ENDO CVD 5DCS (MISCELLANEOUS) IMPLANT
SEALANT PROGEL (MISCELLANEOUS) IMPLANT
SEALANT SURG COSEAL 4ML (VASCULAR PRODUCTS) IMPLANT
SEALANT SURG COSEAL 8ML (VASCULAR PRODUCTS) IMPLANT
SOLUTION ANTI FOG 6CC (MISCELLANEOUS) ×3 IMPLANT
SPONGE GAUZE 4X4 12PLY STER LF (GAUZE/BANDAGES/DRESSINGS) ×2 IMPLANT
SPONGE INTESTINAL PEANUT (DISPOSABLE) IMPLANT
STAPLER ENDO GIA 12 SHRT THIN (STAPLE) IMPLANT
STAPLER ENDO GIA 12MM SHORT (STAPLE) ×1 IMPLANT
STAPLER VISISTAT 35W (STAPLE) ×2 IMPLANT
SUT PROLENE 4 0 RB 1 (SUTURE) ×3
SUT PROLENE 4-0 RB1 .5 CRCL 36 (SUTURE) IMPLANT
SUT SILK  1 MH (SUTURE) ×6
SUT SILK 1 MH (SUTURE) ×2 IMPLANT
SUT SILK 1 TIES 10X30 (SUTURE) ×2 IMPLANT
SUT SILK 2 0 SH (SUTURE) IMPLANT
SUT SILK 2 0SH CR/8 30 (SUTURE) ×2 IMPLANT
SUT SILK 3 0 SH 30 (SUTURE) IMPLANT
SUT SILK 3 0SH CR/8 30 (SUTURE) ×2 IMPLANT
SUT VIC AB 1 CTX 36 (SUTURE) ×6
SUT VIC AB 1 CTX36XBRD ANBCTR (SUTURE) IMPLANT
SUT VIC AB 2-0 CTX 36 (SUTURE) ×4 IMPLANT
SUT VIC AB 2-0 UR6 27 (SUTURE) IMPLANT
SUT VIC AB 3-0 MH 27 (SUTURE) IMPLANT
SUT VIC AB 3-0 X1 27 (SUTURE) ×8 IMPLANT
SUT VICRYL 2 TP 1 (SUTURE) ×4 IMPLANT
SWAB COLLECTION DEVICE MRSA (MISCELLANEOUS) IMPLANT
SYSTEM SAHARA CHEST DRAIN ATS (WOUND CARE) ×1 IMPLANT
TAPE CLOTH 4X10 WHT NS (GAUZE/BANDAGES/DRESSINGS) ×1 IMPLANT
TAPE CLOTH SURG 4X10 WHT LF (GAUZE/BANDAGES/DRESSINGS) ×2 IMPLANT
TIP APPLICATOR SPRAY EXTEND 16 (VASCULAR PRODUCTS) IMPLANT
TOWEL OR 17X24 6PK STRL BLUE (TOWEL DISPOSABLE) ×3 IMPLANT
TOWEL OR 17X26 10 PK STRL BLUE (TOWEL DISPOSABLE) ×3 IMPLANT
TRAP SPECIMEN MUCOUS 40CC (MISCELLANEOUS) IMPLANT
TRAY FOLEY CATH 16FRSI W/METER (SET/KITS/TRAYS/PACK) ×1 IMPLANT
TROCAR XCEL BLADELESS 5X75MML (TROCAR) ×1 IMPLANT
TUBE ANAEROBIC SPECIMEN COL (MISCELLANEOUS) IMPLANT
TUBE CONNECTING 12'X1/4 (SUCTIONS) ×1
TUBE CONNECTING 12X1/4 (SUCTIONS) ×1 IMPLANT
TUNNELER SHEATH ON-Q 11GX8 (MISCELLANEOUS) ×2 IMPLANT
TUNNELER SHEATH ON-Q 16GX12 DP (PAIN MANAGEMENT) IMPLANT
WATER STERILE IRR 1000ML POUR (IV SOLUTION) ×2 IMPLANT
YANKAUER SUCT BULB TIP NO VENT (SUCTIONS) ×2 IMPLANT
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
## 2014-05-10 NOTE — Progress Notes (Signed)
Still having incisional pain  BP 111/38 mmHg  Pulse 71  Temp(Src) 97.8 F (36.6 C) (Oral)  Resp 16  Ht 6\' 2"  (1.88 m)  Wt 220 lb (99.791 kg)  BMI 28.23 kg/m2  SpO2 99%   Intake/Output Summary (Last 24 hours) at 05/10/14 2000 Last data filed at 05/10/14 1800  Gross per 24 hour  Intake 5458.75 ml  Output   5640 ml  Net -181.25 ml    Air leak has resolved  Doing well POD # 1

## 2014-05-10 NOTE — ED Notes (Signed)
Transported to CT scan with Dr. Dwain SarnaWakefield and RN.
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
## 2014-05-10 NOTE — ED Notes (Addendum)
Pt transported to OR from CT

## 2014-05-10 NOTE — ED Notes (Signed)
Pt transported to CT ?

## 2014-05-10 NOTE — Transfer of Care (Signed)
Immediate Anesthesia Transfer of Care Note  Patient: Micheal Gentry  Procedure(s) Performed: Procedure(s): VIDEO ASSISTED THORACOSCOPY (VATS)/THOROCOTOMY (Left)  Patient Location: PACU  Anesthesia Type:General  Level of Consciousness: sedated and confused  Airway & Oxygen Therapy: Patient Spontanous Breathing and Patient connected to face mask oxygen  Post-op Assessment: Report given to PACU RN, Post -op Vital signs reviewed and stable and Patient moving all extremities X 4  Post vital signs: Reviewed and stable  Complications: No apparent anesthesia complications

## 2014-05-10 NOTE — Progress Notes (Signed)
Day of Surgery Procedure(s) (LRB): VIDEO ASSISTED THORACOSCOPY (VATS)/THOROCOTOMY (Left) Subjective: C/o chest hurting hungry  Objective: Vital signs in last 24 hours: Temp:  [97 F (36.1 C)-98.1 F (36.7 C)] 97.9 F (36.6 C) (12/19 0700) Pulse Rate:  [55-94] 76 (12/19 0800) Cardiac Rhythm:  [-] Normal sinus rhythm (12/19 0800) Resp:  [11-38] 11 (12/19 0800) BP: (117-159)/(47-104) 117/51 mmHg (12/19 0800) SpO2:  [96 %-100 %] 97 % (12/19 0800) Arterial Line BP: (100-213)/(53-99) 162/56 mmHg (12/19 0800) Weight:  [220 lb (99.791 kg)] 220 lb (99.791 kg) (12/18 2359)  Hemodynamic parameters for last 24 hours:    Intake/Output from previous day: 12/18 0701 - 12/19 0700 In: 4300 [I.V.:4300] Out: 3505 [Urine:2725; Blood:500; Chest Tube:280] Intake/Output this shift: Total I/O In: 293.8 [I.V.:93.8; IV Piggyback:200] Out: 245 [Urine:225; Chest Tube:20]  General appearance: alert and no distress Neurologic: intact Heart: regular rate and rhythm Lungs: clear to auscultation bilaterally serous drainage from CT, small air leak  Lab Results:  Recent Labs  05/10/14 05/10/14 0009  WBC 13.3*  --   HGB 14.1 14.6  HCT 42.1 43.0  PLT 282  --    BMET:  Recent Labs  05/10/14 05/10/14 0009  NA 138 142  K 4.0 3.9  CL 102 106  CO2 24  --   GLUCOSE 116* 118*  BUN 11 12  CREATININE 0.90 1.00  CALCIUM 9.3  --     PT/INR:  Recent Labs  05/10/14  LABPROT 15.4*  INR 1.20   ABG    Component Value Date/Time   PHART 7.323* 05/10/2014 0650   HCO3 21.0 05/10/2014 0650   TCO2 22 05/10/2014 0650   ACIDBASEDEF 5.0* 05/10/2014 0650   O2SAT 94.0 05/10/2014 0650   CBG (last 3)   Recent Labs  05/10/14 0347  GLUCAP 149*    Assessment/Plan: S/P Procedure(s) (LRB): VIDEO ASSISTED THORACOSCOPY (VATS)/THOROCOTOMY (Left) Plan for transfer to step-down: see transfer orders  POD # 1 left thoracotomy, lingular segmentectomy  Hemodynamically stable Some pain- OnQ, PCA,  toradol Hct OK Enoxaparin for DVT prophylaxis OOB and ambulate Advance diet   LOS: 1 day    Micheal Gentry C 05/10/2014

## 2014-05-10 NOTE — Anesthesia Procedure Notes (Signed)
Procedure Name: Intubation Date/Time: 05/10/2014 12:57 AM Performed by: Molli HazardGORDON, Rigo Letts M Pre-anesthesia Checklist: Patient identified, Emergency Drugs available, Suction available and Patient being monitored Patient Re-evaluated:Patient Re-evaluated prior to inductionOxygen Delivery Method: Circle system utilized Preoxygenation: Pre-oxygenation with 100% oxygen Intubation Type: IV induction, Rapid sequence and Cricoid Pressure applied Laryngoscope Size: Miller and 2 Grade View: Grade I Endobronchial tube: Left, Double lumen EBT, EBT position confirmed by fiberoptic bronchoscope and EBT position confirmed by auscultation and 39 Fr Number of attempts: 1 Airway Equipment and Method: Stylet Placement Confirmation: ETT inserted through vocal cords under direct vision,  positive ETCO2 and breath sounds checked- equal and bilateral Tube secured with: Tape Dental Injury: Teeth and Oropharynx as per pre-operative assessment
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
## 2014-05-10 NOTE — Anesthesia Postprocedure Evaluation (Signed)
  Anesthesia Post-op Note  Patient: Micheal Gentry  Procedure(s) Performed: Procedure(s): VIDEO ASSISTED THORACOSCOPY (VATS)/THOROCOTOMY (Left)  Patient Location: PACU  Anesthesia Type:General  Level of Consciousness: awake, alert , oriented and patient cooperative  Airway and Oxygen Therapy: Patient Spontanous Breathing  Post-op Pain: moderate  Post-op Assessment: Post-op Vital signs reviewed, Patient's Cardiovascular Status Stable, Respiratory Function Stable, Patent Airway, No signs of Nausea or vomiting and Pain level controlled  Post-op Vital Signs: stable  Last Vitals:  Filed Vitals:   05/10/14 0445  BP: 147/59  Pulse: 87  Temp:   Resp: 31    Complications: No apparent anesthesia complications

## 2014-05-11 ENCOUNTER — Inpatient Hospital Stay (HOSPITAL_COMMUNITY): Payer: No Typology Code available for payment source

## 2014-05-11 LAB — CBC
HEMATOCRIT: 36.6 % — AB (ref 39.0–52.0)
Hemoglobin: 11.6 g/dL — ABNORMAL LOW (ref 13.0–17.0)
MCH: 28 pg (ref 26.0–34.0)
MCHC: 31.7 g/dL (ref 30.0–36.0)
MCV: 88.4 fL (ref 78.0–100.0)
Platelets: 150 10*3/uL (ref 150–400)
RBC: 4.14 MIL/uL — AB (ref 4.22–5.81)
RDW: 13.9 % (ref 11.5–15.5)
WBC: 5.8 10*3/uL (ref 4.0–10.5)

## 2014-05-11 LAB — COMPREHENSIVE METABOLIC PANEL
ALT: 10 U/L (ref 0–53)
AST: 17 U/L (ref 0–37)
Albumin: 2.8 g/dL — ABNORMAL LOW (ref 3.5–5.2)
Alkaline Phosphatase: 64 U/L (ref 39–117)
Anion gap: 8 (ref 5–15)
BILIRUBIN TOTAL: 0.3 mg/dL (ref 0.3–1.2)
BUN: 7 mg/dL (ref 6–23)
CO2: 27 mEq/L (ref 19–32)
CREATININE: 0.83 mg/dL (ref 0.50–1.35)
Calcium: 8.4 mg/dL (ref 8.4–10.5)
Chloride: 101 mEq/L (ref 96–112)
GFR calc Af Amer: >90 mL/min (ref >90)
GFR calc non Af Amer: >90 mL/min (ref >90)
Glucose, Bld: 115 mg/dL — ABNORMAL HIGH (ref 70–99)
Potassium: 4.3 mEq/L (ref 3.7–5.3)
Sodium: 136 mEq/L — ABNORMAL LOW (ref 137–147)
Total Protein: 5.4 g/dL — ABNORMAL LOW (ref 6.0–8.3)

## 2014-05-11 LAB — GLUCOSE, CAPILLARY
Glucose-Capillary: 112 mg/dL — ABNORMAL HIGH (ref 70–99)
Glucose-Capillary: 126 mg/dL — ABNORMAL HIGH (ref 70–99)
Glucose-Capillary: 177 mg/dL — ABNORMAL HIGH (ref 70–99)

## 2014-05-11 MED ORDER — KETOROLAC TROMETHAMINE 30 MG/ML IJ SOLN: 30.0000 mg | Freq: Four times a day (QID) | INTRAMUSCULAR | Status: DC | PRN

## 2014-05-11 MED ORDER — INFLUENZA VAC SPLIT QUAD 0.5 ML IM SUSY
0.5000 mL | PREFILLED_SYRINGE | INTRAMUSCULAR | Status: AC
  Administered 2014-05-12: 0.5 mL via INTRAMUSCULAR
  Filled 2014-05-11: qty 0.5
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
## 2014-05-12 ENCOUNTER — Inpatient Hospital Stay (HOSPITAL_COMMUNITY): Payer: No Typology Code available for payment source

## 2014-05-12 LAB — TYPE AND SCREEN
ABO/RH(D): O POS
Antibody Screen: NEGATIVE
UNIT DIVISION: 0
Unit division: 0
Unit division: 0
Unit division: 0

## 2014-05-12 LAB — GLUCOSE, CAPILLARY
GLUCOSE-CAPILLARY: 130 mg/dL — AB (ref 70–99)
GLUCOSE-CAPILLARY: 136 mg/dL — AB (ref 70–99)
Glucose-Capillary: 116 mg/dL — ABNORMAL HIGH (ref 70–99)
Glucose-Capillary: 80 mg/dL (ref 70–99)
Glucose-Capillary: 120 mg/dL — ABNORMAL HIGH (ref 70–99)

## 2014-05-12 LAB — CBC
HEMATOCRIT: 35.8 % — AB (ref 39.0–52.0)
Hemoglobin: 11.5 g/dL — ABNORMAL LOW (ref 13.0–17.0)
MCH: 28.2 pg (ref 26.0–34.0)
MCHC: 32.1 g/dL (ref 30.0–36.0)
MCV: 87.7 fL (ref 78.0–100.0)
PLATELETS: 172 10*3/uL (ref 150–400)
RBC: 4.08 MIL/uL — ABNORMAL LOW (ref 4.22–5.81)
RDW: 13.5 % (ref 11.5–15.5)
WBC: 7 10*3/uL (ref 4.0–10.5)

## 2014-05-12 LAB — BASIC METABOLIC PANEL
ANION GAP: 8 (ref 5–15)
BUN: 8 mg/dL (ref 6–23)
CO2: 32 meq/L (ref 19–32)
Calcium: 8.7 mg/dL (ref 8.4–10.5)
Chloride: 98 mEq/L (ref 96–112)
Creatinine, Ser: 0.9 mg/dL (ref 0.50–1.35)
GFR calc Af Amer: >90 mL/min (ref >90)
GFR calc non Af Amer: >90 mL/min (ref >90)
Glucose, Bld: 121 mg/dL — ABNORMAL HIGH (ref 70–99)
Potassium: 4 mEq/L (ref 3.7–5.3)
Sodium: 138 mEq/L (ref 137–147)
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
## 2014-05-12 NOTE — Progress Notes (Addendum)
       [address]       [place] [id number]             [phone]          2 Days Post-Op Procedure(s) (LRB): VIDEO ASSISTED THORACOSCOPY (VATS)/THOROCOTOMY (Left)  Subjective: Comfortable, pain controlled.  Breathing stable.   Objective: Vital signs in last 24 hours: Patient Vitals for the past 24 hrs:  BP Temp Temp src Pulse Resp SpO2  05/12/14 0800 - - - - 15 97 %  05/12/14 0700 - 97.5 F (36.4 C) Oral - - -  05/12/14 0502 (!) 117/43 mmHg 98.2 F (36.8 C) Oral 82 19 96 %  05/12/14 0025 (!) 128/56 mmHg 98.1 F (36.7 C) Oral 90 (!) 24 99 %  05/11/14 2002 (!) 127/54 mmHg 98.8 F (37.1 C) Oral 87 (!) 23 100 %  05/11/14 1505 - - - - 19 99 %  05/11/14 1342 (!) 131/58 mmHg - - - - -  05/11/14 1300 (!) 128/53 mmHg - - 82 15 94 %  05/11/14 1200 (!) 121/55 mmHg - - 66 16 100 %  05/11/14 1100 (!) 118/42 mmHg 98.3 F (36.8 C) - 61 12 98 %  05/11/14 1013 - - - - 17 100 %  05/11/14 1000 - - - 76 (!) 24 97 %   Current Weight  05/09/14 220 lb (99.791 kg)     Intake/Output from previous day: 12/20 0701 - 12/21 0700 In: 1260.7 [P.O.:840; I.V.:420.7] Out: 3385 [Urine:3175; Chest Tube:210]    PHYSICAL EXAM:  Heart: RRR Lungs: Diminished BS in bases Wound: Dressed and dry Chest tube: No air leak   Lab Results: CBC: Recent Labs  05/11/14 0635 05/12/14 0315  WBC 5.8 7.0  HGB 11.6* 11.5*  HCT 36.6* 35.8*  PLT 150 172   BMET:  Recent Labs  05/11/14 0635 05/12/14 0315  NA 136* 138  K 4.3 4.0  CL 101 98  CO2 27 32  GLUCOSE 115* 121*  BUN 7 8  CREATININE 0.83 0.90  CALCIUM 8.4 8.7    PT/INR:  Recent Labs  05/10/14  LABPROT 15.4*  INR 1.20   CXR: FINDINGS: The lungs are well-expanded. There is a persistent small left lateral pneumothorax visible in the mid thorax. The remaining left-sided chest tube tip projects over the posterior medial aspect of the fourth rib. Surgical suture material in the left mid lung is visible and  appears stable. The mediastinum is normal in width. The cardiac silhouette and pulmonary vascularity are normal. There is no significant pleural effusion. The bony thorax is unremarkable.  IMPRESSION: There is a persistent small left-sided pneumothorax. The remaining left chest tube is in reasonable position radiographically. The examination is otherwise unchanged.   Assessment/Plan: S/P Procedure(s) (LRB): VIDEO ASSISTED THORACOSCOPY (VATS)/THOROCOTOMY (Left) CT just placed to water seal and no air leak present with cough.  Continue to water seal. Ambulate, continue pulm toilet.    LOS: 3 days    COLLINS,GINA H 05/12/2014  Patient seen and examined, agree with above His CT was back on suction despite order to be on water seal CT placed to water seal, check CXR i 2 hours If CXR oK and no air leak - dc CT
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
## 2014-05-13 ENCOUNTER — Encounter (HOSPITAL_COMMUNITY): Payer: Self-pay | Admitting: Thoracic Surgery (Cardiothoracic Vascular Surgery)

## 2014-05-13 ENCOUNTER — Inpatient Hospital Stay (HOSPITAL_COMMUNITY): Payer: No Typology Code available for payment source

## 2014-05-13 LAB — GLUCOSE, CAPILLARY: GLUCOSE-CAPILLARY: 108 mg/dL — AB (ref 70–99)

## 2014-05-13 MED ORDER — HYDROMORPHONE HCL 1 MG/ML IJ SOLN: 1.0000 mg | Freq: Four times a day (QID) | INTRAMUSCULAR | Status: DC | PRN

## 2014-05-13 MED ORDER — POLYETHYLENE GLYCOL 3350 17 G PO PACK
17.0000 g | PACK | Freq: Every day | ORAL | Status: DC | PRN
  Administered 2014-05-13: 17 g via ORAL
  Filled 2014-05-13 (×2): qty 1

## 2014-05-13 MED ORDER — DOCUSATE SODIUM 100 MG PO CAPS
200.0000 mg | ORAL_CAPSULE | Freq: Every day | ORAL | Status: DC
  Administered 2014-05-13 – 2014-05-14 (×2): 200 mg via ORAL
  Filled 2014-05-13 (×3): qty 2

## 2014-05-13 MED ORDER — DEXTROSE-NACL 5-0.9 % IV SOLN: INTRAVENOUS | Status: DC

## 2014-05-13 MED ORDER — MAGNESIUM HYDROXIDE 400 MG/5ML PO SUSP: 30.0000 mL | Freq: Every day | ORAL | Status: DC | PRN

## 2014-05-13 NOTE — Progress Notes (Signed)
Report called to Teresa CoombsBetty Jo RN on 6N.
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
## 2014-05-13 NOTE — Progress Notes (Signed)
Pt transferred to 6N12. Fiance at bedside and aware.
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
## 2014-05-14 ENCOUNTER — Inpatient Hospital Stay (HOSPITAL_COMMUNITY): Payer: No Typology Code available for payment source

## 2014-05-14 DIAGNOSIS — D62 Acute posthemorrhagic anemia: Secondary | ICD-10-CM | POA: Diagnosis not present

## 2014-05-14 DIAGNOSIS — S20359A Superficial foreign body of unspecified front wall of thorax, initial encounter: Secondary | ICD-10-CM | POA: Diagnosis present

## 2014-05-14 MED ORDER — OXYCODONE HCL 5 MG PO TABS: 5.0000 mg | ORAL_TABLET | ORAL | Status: DC | PRN

## 2014-05-14 NOTE — Progress Notes (Signed)
UR completed.  [facility].  Provided with letter, list of participating pharmacies and an explanation of the process. Pt/caregiver stated understanding.  Carlyle LipaMichelle Chaia Ikard, RN BSN MHA CCM Trauma/Neuro ICU Case Manager [phone]

## 2014-05-14 NOTE — Progress Notes (Signed)
Denton ArJonathan XXXGlasco to be D/C'd Home per MD order.  Discussed with the patient and all questions fully answered.    Medication List    TAKE these medications        oxyCODONE 5 MG immediate release tablet  Commonly known as:  Oxy IR/ROXICODONE  Take 1-2 tablets (5-10 mg total) by mouth every 4 (four) hours as needed for severe pain.        VSS. Surgical site clean, dry, intact with no sign of infection.  IV catheter discontinued intact. Site without signs and symptoms of complications. Dressing and pressure applied.  An After Visit Summary was printed and given to the patient. Patient received prescription for pain medication.  D/c education completed with patient/family including follow up instructions, medication list, d/c activities limitations if indicated, with other d/c instructions as indicated by MD - patient able to verbalize understanding, all questions fully answered.   Patient instructed to return to ED, call 911, or call MD for any changes in condition.   Patient escorted via WC, and D/C home via private auto.  Burt EkCook, Thales Knipple D 05/14/2014 9:22 AM
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
## 2014-05-14 NOTE — Discharge Instructions (Signed)
Wash wounds daily in shower with soap and water. Do not soak. Apply antibiotic ointment (e.g. Neosporin) twice daily and as needed to keep moist. Cover with dry dressing.  

## 2014-05-22 ENCOUNTER — Encounter (HOSPITAL_COMMUNITY): Payer: Self-pay | Admitting: Emergency Medicine

## 2014-05-22 ENCOUNTER — Encounter (INDEPENDENT_AMBULATORY_CARE_PROVIDER_SITE_OTHER): Payer: Self-pay | Admitting: *Deleted

## 2014-05-22 ENCOUNTER — Emergency Department (HOSPITAL_COMMUNITY)
Admission: EM | Admit: 2014-05-22 | Discharge: 2014-05-22 | Disposition: A | Discharge status: Home / Self Care | Payer: Self-pay | Attending: Emergency Medicine | Admitting: Emergency Medicine

## 2014-05-22 ENCOUNTER — Ambulatory Visit: Payer: Self-pay | Admitting: *Deleted

## 2014-05-22 ENCOUNTER — Other Ambulatory Visit: Payer: Self-pay | Admitting: *Deleted

## 2014-05-22 DIAGNOSIS — S272XXA Traumatic hemopneumothorax, initial encounter: Secondary | ICD-10-CM

## 2014-05-22 DIAGNOSIS — Z87828 Personal history of other (healed) physical injury and trauma: Secondary | ICD-10-CM | POA: Insufficient documentation

## 2014-05-22 DIAGNOSIS — Z8659 Personal history of other mental and behavioral disorders: Secondary | ICD-10-CM | POA: Insufficient documentation

## 2014-05-22 DIAGNOSIS — Z720 Tobacco use: Secondary | ICD-10-CM | POA: Insufficient documentation

## 2014-05-22 DIAGNOSIS — Z791 Long term (current) use of non-steroidal anti-inflammatories (NSAID): Secondary | ICD-10-CM | POA: Insufficient documentation

## 2014-05-22 DIAGNOSIS — Z8709 Personal history of other diseases of the respiratory system: Secondary | ICD-10-CM | POA: Insufficient documentation

## 2014-05-22 DIAGNOSIS — Z792 Long term (current) use of antibiotics: Secondary | ICD-10-CM | POA: Insufficient documentation

## 2014-05-22 DIAGNOSIS — Z4801 Encounter for change or removal of surgical wound dressing: Secondary | ICD-10-CM | POA: Insufficient documentation

## 2014-05-22 DIAGNOSIS — Z5189 Encounter for other specified aftercare: Secondary | ICD-10-CM

## 2014-05-22 NOTE — ED Notes (Signed)
Instructed to go directly to CVTS office.

## 2014-05-22 NOTE — Discharge Instructions (Signed)
Please go straight to the cardiothoracic office for post op evaluation.

## 2014-05-22 NOTE — Progress Notes (Signed)
Micheal Gentry returns s/p Left Thoracotomy for the removal of staples from a incision parallel to the left axilla and sutures from two previous chest tube sites.  There is also a horizontal  thoracotomy incision of the left lateral chest wall.  All operative incisions are very well healed. He will return for f/u as scheduled with a chest xray.  The trauma service provided him with pain med which he still has an adequate supply.

## 2014-05-22 NOTE — Progress Notes (Signed)
Erroneous note °This encounter was created in error - please disregard. °
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
## 2014-05-29 ENCOUNTER — Other Ambulatory Visit: Payer: Self-pay | Admitting: Thoracic Surgery (Cardiothoracic Vascular Surgery)

## 2014-05-29 DIAGNOSIS — S21112D Laceration without foreign body of left front wall of thorax without penetration into thoracic cavity, subsequent encounter: Secondary | ICD-10-CM

## 2014-06-03 ENCOUNTER — Encounter: Payer: Self-pay | Admitting: Thoracic Surgery (Cardiothoracic Vascular Surgery)

## 2014-06-03 ENCOUNTER — Ambulatory Visit (INDEPENDENT_AMBULATORY_CARE_PROVIDER_SITE_OTHER): Payer: Self-pay | Admitting: Thoracic Surgery (Cardiothoracic Vascular Surgery)

## 2014-06-03 ENCOUNTER — Ambulatory Visit: Payer: Self-pay

## 2014-06-03 ENCOUNTER — Ambulatory Visit (INDEPENDENT_AMBULATORY_CARE_PROVIDER_SITE_OTHER): Payer: No Typology Code available for payment source | Admitting: Thoracic Surgery (Cardiothoracic Vascular Surgery)

## 2014-06-03 ENCOUNTER — Ambulatory Visit
Admission: RE | Admit: 2014-06-03 | Discharge: 2014-06-03 | Disposition: A | Discharge status: Home / Self Care | Payer: No Typology Code available for payment source | Source: Ambulatory Visit | Attending: Thoracic Surgery (Cardiothoracic Vascular Surgery) | Admitting: Thoracic Surgery (Cardiothoracic Vascular Surgery)

## 2014-06-03 VITALS — BP 110/65 | HR 70 | Ht 74.0 in | Wt 230.0 lb

## 2014-06-03 DIAGNOSIS — S21312D Laceration without foreign body of left front wall of thorax with penetration into thoracic cavity, subsequent encounter: Secondary | ICD-10-CM

## 2014-06-03 DIAGNOSIS — S21319A Laceration without foreign body of unspecified front wall of thorax with penetration into thoracic cavity, initial encounter: Secondary | ICD-10-CM | POA: Insufficient documentation

## 2014-06-03 DIAGNOSIS — S21112D Laceration without foreign body of left front wall of thorax without penetration into thoracic cavity, subsequent encounter: Secondary | ICD-10-CM

## 2014-06-03 MED ORDER — OXYCODONE HCL 5 MG PO TABS
5.0000 mg | ORAL_TABLET | Freq: Four times a day (QID) | ORAL | Status: AC | PRN
Start: 2014-06-03 — End: ?

## 2014-06-03 NOTE — Progress Notes (Unsigned)
This encounter was created in error - please disregard.
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
## 2014-06-04 NOTE — Progress Notes (Signed)
Patient ID: Micheal KalesJonathan Ashlock, male   DOB: 03-13-1986, 29 y.o.   MRN: [id number] See note in merged chart

## 2014-06-13 ENCOUNTER — Encounter: Payer: Self-pay | Admitting: Thoracic Surgery (Cardiothoracic Vascular Surgery)

## 2019-09-09 LAB — HEPATIC FUNCTION PANEL
ALT: 22 U/L (ref 0–41)
AST: 18 U/L (ref 0–40)
Albumin: 4.4 g/dL (ref 3.5–5.2)
Alk Phosphatase: 97 U/L (ref 40–130)
Bilirubin, Direct: <0.2 mg/dL (ref 0.00–0.30)
Total Bilirubin: 0.38 mg/dL (ref 0.00–1.20)
Total Protein: 7 g/dL (ref 6.4–8.3)

## 2019-09-09 LAB — CBC WITH AUTO DIFFERENTIAL
Absolute Baso #: 0 10*3/uL (ref 0.0–0.2)
Absolute Eos #: 0 10*3/uL (ref 0.0–0.5)
Absolute Lymph #: 1.7 10*3/uL (ref 1.0–3.2)
Absolute Mono #: 0.4 10*3/uL (ref 0.3–1.0)
Basophils %: 0.4 % (ref 0.0–2.0)
Eosinophils %: 0.4 % (ref 0.0–7.0)
Hematocrit: 45.6 % (ref 38.0–52.0)
Hemoglobin: 15.3 g/dL (ref 13.0–17.3)
Immature Grans (Abs): 0 10*3/uL (ref 0.00–0.06)
Immature Granulocytes: 0 % — ABNORMAL LOW (ref 0.1–0.6)
Lymphocytes: 32.6 % (ref 15.0–45.0)
MCH: 28.4 pg (ref 27.0–34.5)
MCHC: 33.6 g/dL (ref 32.0–36.0)
MCV: 84.8 fL (ref 84.0–100.0)
MPV: 11 fL (ref 7.2–13.2)
Monocytes: 8 % (ref 4.0–12.0)
NRBC Absolute: 0 10*3/uL (ref 0.000–0.012)
NRBC Automated: 0 % (ref 0.0–0.2)
Neutrophils %: 58.6 % (ref 42.0–74.0)
Neutrophils Absolute: 3.1 10*3/uL (ref 1.6–7.3)
Platelets: 167 10*3/uL (ref 140–440)
RBC: 5.38 x10e6/mcL (ref 4.00–5.60)
RDW: 13.3 % (ref 11.0–16.0)
WBC: 5.2 10*3/uL (ref 3.8–10.6)

## 2019-09-09 LAB — BASIC METABOLIC PANEL
Anion Gap: 7 mmol/L (ref 2–17)
BUN: 9 mg/dL (ref 6–20)
CO2: 27 mmol/L (ref 22–29)
Calcium: 8.9 mg/dL (ref 8.6–10.0)
Chloride: 105 mmol/L (ref 98–107)
Creatinine: 0.8 mg/dL (ref 0.7–1.3)
GFR African American: 135 mL/min/{1.73_m2} (ref >=90)
GFR Non-African American: 117 mL/min/{1.73_m2} (ref >=90)
Glucose: 109 mg/dL — ABNORMAL HIGH (ref 70–99)
OSMOLALITY CALCULATED: 277 mOsm/kg (ref 270–287)
Potassium: 4.8 mmol/L (ref 3.5–5.3)
Sodium: 139 mmol/L (ref 135–145)

## 2019-09-09 LAB — LIPASE: Lipase: 16 U/L (ref 13–60)

## 2019-09-09 LAB — LACTIC ACID: Lactic Acid: 0.7 mmol/L (ref 0.5–2.0)

## 2019-09-09 NOTE — ED Notes (Signed)
ED Triage Note       ED Secondary Triage Entered On:  09/09/2019 11:20 EDT    Performed On:  09/09/2019 11:20 EDT by Fanny Dance, RN, BRITTNAY               General Information   Barriers to Learning :   None evident   ED Home Meds Section :   Document assessment   [facility] ED Fall Risk Section :   Document assessment   ED Advance Directives Section :   Document assessment   ED Palliative Screen :   N/A (prefilled for <34yo)   Fanny Dance, RN, BRITTNAY - 09/09/2019 11:20 EDT   (As Of: 09/09/2019 11:20:53 EDT)   Problems(Active)    No Chronic Problems (Cerner  :NKP )  Name of Problem:   No Chronic Problems ; Recorder:   Fanny Dance, RN, BRITTNAY; Code:   NKP ; Last Updated:   09/09/2019 11:19 EDT ; Life Cycle Date:   09/09/2019 ; Life Cycle Status:   Active ; Vocabulary:   Cerner          Diagnoses(Active)    Epigastric Pain  Date:   09/09/2019 ; Diagnosis Type:   Reason For Visit ; Confirmation:   Complaint of ; Clinical Dx:   Epigastric Pain ; Classification:   Medical ; Clinical Service:   Emergency medicine ; Code:   PNED ; Probability:   0 ; Diagnosis Code:   [id number]             -    Procedure History   (As Of: 09/09/2019 11:20:53 EDT)     Phoebe Perch Fall Risk Assessment Tool   Hx of falling last 3 months ED Fall :   No   Fanny Dance RN, BRITTNAY - 09/09/2019 11:20 EDT   ED Advance Directive   Advance Directive :   No   Fanny Dance, RN, BRITTNAY - 09/09/2019 11:20 EDT

## 2019-09-09 NOTE — ED Notes (Signed)
ED Patient Summary       ;       [facility] Emergency Department  [address] [place], [place] [address]  [phone]  Discharge Instructions (Patient)  _______________________________________     Name: Curtis Cunningham, Curtis Cunningham  DOB:  05/13/1986                   MRN: [id number]                   FIN: [phone]%[phone]  Reason For Visit: Abdominal pain; Epigastric Pain; ABDOMINAL PAIN  Final Diagnosis: Epigastric pain; Gastritis     Visit Date: 09/09/2019 11:07:00  Address: [address] [place] [address]  Phone: [phone]     Emergency Department Providers:         Primary Physician:            [facility] would like to thank you for allowing [place] to assist you with your healthcare needs. The following includes patient education materials and information regarding your injury/illness.     Follow-up Instructions:  You were seen today on an emergency basis. Please contact your primary care doctor for a follow up appointment. If you received a referral to a specialist doctor, it is important you follow-up as instructed.    It is important that you call your follow-up doctor to schedule and confirm the location of your next appointment. Your doctor may practice at multiple locations. The office location of your follow-up appointment may be different to the one written on your discharge instructions.    If you do not have a primary care doctor, please call [phone] for help in finding a [facility] Provider. For help in finding a specialist doctor, please call [phone].    The [facility] "Ask a Nurse" line in staffed by Registered Nurses and is a free service to the community. We are available Monday - Friday from 8am to 5pm to answer your questions about your health. Please call [phone].    If your condition gets worse before your follow-up with your primary care doctor or specialist, please return to the Emergency Department.      Coronavirus 2019 (COVID-19)  Reminders:     Patients aged 77 and older, people with increased risk for severe COVID-19 disease, or frontline workers with increased occupational risk can make an appointment for a COVID-19 vaccine. Patients can contact their [facility] doctors' offices to schedule an appointment to receive the COVID-19 vaccine at the [facility] or send [place] an email at [email or web address]. Patients who do not have a [facility] physician can call [phone] to schedule vaccination appointments.            Scan this code with your phone camera to send an email to the address above.          Follow Up Appointments:  Primary Care Provider:      Name: PCP,  NONE      Phone:                  With: Address: When:   [facility] Call for appt & office location   [phone] Business (1) Within 1 week   Comments:   Return to ED if symptoms worsen              Printed Prescriptions:    Patient Education Materials:  Discharge Orders  Discharge Patient 09/09/19 13:08:00 EDT         Comment:      Abdominal Pain, Adult; Gastritis, Adult     Abdominal Pain, Adult    Many things can cause abdominal pain. Usually, abdominal pain is not caused by a disease and will improve without treatment. It can often be observed and treated at home. Your health care provider will do a physical exam and possibly order blood tests and X-rays to help determine the seriousness of your pain. However, in many cases, more time must pass before a clear cause of the pain can be found. Before that point, your health care provider may not know if you need more testing or further treatment.      HOME CARE INSTRUCTIONS    Monitor your abdominal pain for any changes. The following actions may help to alleviate any discomfort you are experiencing:     Only take over-the-counter or prescription medicines as directed by your health care provider.     Do not take laxatives unless directed to  do so by your health care provider.     Try a clear liquid diet (broth, tea, or water) as directed by your health care provider. Slowly move to a bland diet as tolerated.    SEEK MEDICAL CARE IF:     You have unexplained abdominal pain.     You have abdominal pain associated with nausea or diarrhea.     You have pain when you urinate or have a bowel movement.     You experience abdominal pain that wakes you in the night.     You have abdominal pain that is worsened or improved by eating food.     You have abdominal pain that is worsened with eating fatty foods.     You have a fever.    SEEK IMMEDIATE MEDICAL CARE IF:     Your pain does not go away within 2 hours.     You keep throwing up (vomiting).     Your pain is felt only in portions of the abdomen, such as the right side or the left lower portion of the abdomen.     You pass bloody or black tarry stools.    MAKE SURE YOU:     Understand these instructions.     Will watch your condition.     Will get help right away if you are not doing well or get worse.    This information is not intended to replace advice given to you by your health care provider. Make sure you discuss any questions you have with your health care provider.    Document Released: 02/16/2005 Document Revised: 01/28/2015 Document Reviewed: 01/16/2013  Elsevier Interactive Patient Education ?2016 Elsevier Inc.       Gastritis, Adult    Gastritis is soreness and swelling (inflammation) of the lining of the stomach. Gastritis can develop as a sudden onset (acute) or long-term (chronic) condition. If gastritis is not treated, it can lead to stomach bleeding and ulcers.      CAUSES    Gastritis occurs when the stomach lining is weak or damaged. Digestive juices from the stomach then inflame the weakened stomach lining. The stomach lining may be weak or damaged due to viral or bacterial infections. One common bacterial infection is the Helicobacter pylori  infection. Gastritis can also result from  excessive alcohol consumption, taking certain medicines, or having too much acid in the stomach.     SYMPTOMS  In some cases, there are no symptoms. When symptoms are present, they may include:     Pain or a burning sensation in the upper abdomen.     Nausea.     Vomiting.     An uncomfortable feeling of fullness after eating.     DIAGNOSIS    Your caregiver may suspect you have gastritis based on your symptoms and a physical exam. To determine the cause of your gastritis, your caregiver may perform the following:     Blood or stool tests to check for the H pylori bacterium.     Gastroscopy. A thin, flexible tube (endoscope) is passed down the esophagus and into the stomach. The endoscope has a light and camera on the end. Your caregiver uses the endoscope to view the inside of the stomach.     Taking a tissue sample (biopsy) from the stomach to examine under a microscope.     TREATMENT    Depending on the cause of your gastritis, medicines may be prescribed. If you have a bacterial infection, such as an H pylori infection, antibiotics may be given. If your gastritis is caused by too much acid in the stomach, H2 blockers or antacids may be given. Your caregiver may recommend that you stop taking aspirin, ibuprofen, or other nonsteroidal anti-inflammatory drugs (NSAIDs).    HOME CARE INSTRUCTIONS     Only take over-the-counter or prescription medicines as directed by your caregiver.     If you were given antibiotic medicines, take them as directed. Finish them even if you start to feel better.     Drink enough fluids to keep your urine clear or pale yellow.      Avoid foods and drinks that make your symptoms worse, such as:    ? Caffeine or alcoholic drinks.    ? Chocolate.    ? Peppermint or mint flavorings.    ? Garlic and onions.    ? Spicy foods.    ? Citrus fruits, such as oranges, lemons, or limes.    ? Tomato-based foods such as sauce, chili, salsa, and pizza.    ? Fried and fatty foods.     Eat small,  frequent meals instead of large meals.    SEEK IMMEDIATE MEDICAL CARE IF:     You have black or dark red stools.     You vomit blood or material that looks like coffee grounds.     You are unable to keep fluids down.     Your abdominal pain gets worse.     You have a fever.     You do not feel better after 1 week.      You have any other questions or concerns.    MAKE SURE YOU:     Understand these instructions.     Will watch your condition.     Will get help right away if you are not doing well or get worse.    This information is not intended to replace advice given to you by your health care provider. Make sure you discuss any questions you have with your health care provider.    Document Released: 05/03/2001 Document Revised: 11/08/2011 Document Reviewed: 06/22/2011  Elsevier Interactive Patient Education ?2016 Elsevier Inc.         Allergy Info: Septra     Medication Information:  [facility] ED Physicians provided you with a complete list of medications post discharge, if you have been instructed to stop taking a medication  please ensure you also follow up with this information to your Primary Care Physician.  Unless otherwise noted, patient will continue to take medications as prescribed prior to the Emergency Room visit.  Any specific questions regarding your chronic medications and dosages should be discussed with your physician(s) and pharmacist.          dicyclomine (Bentyl 20 mg oral tablet) 1 Tabs Oral (given by mouth) 3 times a day as needed moderate pain (4-7) for 5 Days. Refills: 0.  omeprazole (PriLOSEC 20 mg oral delayed release capsule) 1 Capsules Oral (given by mouth) every day for 7 Days. Refills: 0.      Medications Administered During Visit:              Medication Dose Route   morphine 4 mg IV Push   iopamidol 100 mL IV Contrast   Sodium Chloride 0.9% 1000 mL IV Piggyback          Major Tests and Procedures:  The following procedures and tests were performed during your ED visit.  COMMON  PROCEDURES%>  COMMON PROCEDURES COMMENTS%>          Laboratory Orders  Name Status Details   BMP Completed Blood, Stat, ST - Stat, 09/09/19 12:17:00 EDT, 09/09/19 12:18:00 EDT, Nurse collect, HEFFNER,  LINDSEY K-PA, Print label Y/N   CBCDIFF Completed Blood, Stat, ST - Stat, 09/09/19 12:17:00 EDT, 09/09/19 12:18:00 EDT, Nurse collect, HEFFNER,  LINDSEY K-PA, Print label Y/N   Hepatic Completed Blood, Stat, ST - Stat, 09/09/19 12:17:00 EDT, 09/09/19 12:18:00 EDT, Nurse collect, HEFFNER,  LINDSEY K-PA, Print label Y/N   Lactic Completed Blood, Stat, ST - Stat, 09/09/19 12:17:00 EDT, 09/09/19 12:18:00 EDT, Nurse collect, HEFFNER,  LINDSEY K-PA, Print label Y/N   Lipase Lvl Completed Blood, Stat, ST - Stat, 09/09/19 12:17:00 EDT, 09/09/19 12:18:00 EDT, Nurse collect, HEFFNER,  LINDSEY K-PA, Print label Y/N               Radiology Orders  Name Status Details   CT Abdomen Pelvis w/ Contrast Completed 09/09/19 12:17:00 EDT, STAT 1 hour or less, Reason: Abdominal pain, acute, nonlocalized, Transport Mode: STRETCHER, pp_set_radiology_subspecialty, [id number], 9               Patient Care Orders  Name Status Details   Discharge Patient Ordered 09/09/19 13:08:00 EDT   Discontinue IV Completed 09/09/19 13:08:40 EDT, 09/09/19 13:08:40 EDT   ED Assessment Adult Completed 09/09/19 11:20:34 EDT, 09/09/19 11:20:34 EDT   ED Secondary Triage Completed 09/09/19 11:20:34 EDT, 09/09/19 11:20:34 EDT   ED Triage Adult Completed 09/09/19 11:07:38 EDT, 09/09/19 11:07:38 EDT   POC-Urine Dipstick collect Ordered 09/09/19 12:17:00 EDT, Once, 09/09/19 12:17:00 EDT   Saline Lock Insert Completed 09/09/19 12:17:00 EDT, Once, 09/09/19 12:17:00 EDT       ---------------------------------------------------------------------------------------------------------------------  [facility]) encourages you to self-enroll in the [facility] Patient Portal.  [facility] Patient Portal will allow you to manage your personal  health information securely from your own electronic device now and in the future.  To begin your Patient Portal enrollment process, please visit [email or web address] Click on "Sign up now" under [facility].  If you find that you need additional assistance on the [facility] Patient Portal or need a copy of your medical records, please call the [facility] Medical Records Office at [phone].  Comment:

## 2019-09-09 NOTE — ED Notes (Signed)
ED Patient Education Note     Patient Education Materials Follows:  Emergency Medicine     Abdominal Pain, Adult    Many things can cause abdominal pain. Usually, abdominal pain is not caused by a disease and will improve without treatment. It can often be observed and treated at home. Your health care provider will do a physical exam and possibly order blood tests and X-rays to help determine the seriousness of your pain. However, in many cases, more time must pass before a clear cause of the pain can be found. Before that point, your health care provider may not know if you need more testing or further treatment.      HOME CARE INSTRUCTIONS    Monitor your abdominal pain for any changes. The following actions may help to alleviate any discomfort you are experiencing:     Only take over-the-counter or prescription medicines as directed by your health care provider.     Do not take laxatives unless directed to do so by your health care provider.     Try a clear liquid diet (broth, tea, or water) as directed by your health care provider. Slowly move to a bland diet as tolerated.    SEEK MEDICAL CARE IF:     You have unexplained abdominal pain.     You have abdominal pain associated with nausea or diarrhea.     You have pain when you urinate or have a bowel movement.     You experience abdominal pain that wakes you in the night.     You have abdominal pain that is worsened or improved by eating food.     You have abdominal pain that is worsened with eating fatty foods.     You have a fever.    SEEK IMMEDIATE MEDICAL CARE IF:     Your pain does not go away within 2 hours.     You keep throwing up (vomiting).     Your pain is felt only in portions of the abdomen, such as the right side or the left lower portion of the abdomen.     You pass bloody or black tarry stools.    MAKE SURE YOU:     Understand these instructions.     Will watch your condition.     Will get help right away if you are not doing well or get  worse.    This information is not intended to replace advice given to you by your health care provider. Make sure you discuss any questions you have with your health care provider.    Document Released: 02/16/2005 Document Revised: 01/28/2015 Document Reviewed: 01/16/2013  Elsevier Interactive Patient Education ?2016 Elsevier Inc.         Gastritis, Adult    Gastritis is soreness and swelling (inflammation) of the lining of the stomach. Gastritis can develop as a sudden onset (acute) or long-term (chronic) condition. If gastritis is not treated, it can lead to stomach bleeding and ulcers.      CAUSES    Gastritis occurs when the stomach lining is weak or damaged. Digestive juices from the stomach then inflame the weakened stomach lining. The stomach lining may be weak or damaged due to viral or bacterial infections. One common bacterial infection is the Helicobacter pylori  infection. Gastritis can also result from excessive alcohol consumption, taking certain medicines, or having too much acid in the stomach.     SYMPTOMS    In some cases, there are   no symptoms. When symptoms are present, they may include:     Pain or a burning sensation in the upper abdomen.     Nausea.     Vomiting.     An uncomfortable feeling of fullness after eating.     DIAGNOSIS    Your caregiver may suspect you have gastritis based on your symptoms and a physical exam. To determine the cause of your gastritis, your caregiver may perform the following:     Blood or stool tests to check for the H pylori bacterium.     Gastroscopy. A thin, flexible tube (endoscope) is passed down the esophagus and into the stomach. The endoscope has a light and camera on the end. Your caregiver uses the endoscope to view the inside of the stomach.     Taking a tissue sample (biopsy) from the stomach to examine under a microscope.     TREATMENT    Depending on the cause of your gastritis, medicines may be prescribed. If you have a bacterial infection, such as  an H pylori infection, antibiotics may be given. If your gastritis is caused by too much acid in the stomach, H2 blockers or antacids may be given. Your caregiver may recommend that you stop taking aspirin, ibuprofen, or other nonsteroidal anti-inflammatory drugs (NSAIDs).    HOME CARE INSTRUCTIONS     Only take over-the-counter or prescription medicines as directed by your caregiver.     If you were given antibiotic medicines, take them as directed. Finish them even if you start to feel better.     Drink enough fluids to keep your urine clear or pale yellow.      Avoid foods and drinks that make your symptoms worse, such as:    ? Caffeine or alcoholic drinks.    ? Chocolate.    ? Peppermint or mint flavorings.    ? Garlic and onions.    ? Spicy foods.    ? Citrus fruits, such as oranges, lemons, or limes.    ? Tomato-based foods such as sauce, chili, salsa, and pizza.    ? Fried and fatty foods.     Eat small, frequent meals instead of large meals.    SEEK IMMEDIATE MEDICAL CARE IF:     You have black or dark red stools.     You vomit blood or material that looks like coffee grounds.     You are unable to keep fluids down.     Your abdominal pain gets worse.     You have a fever.     You do not feel better after 1 week.      You have any other questions or concerns.    MAKE SURE YOU:     Understand these instructions.     Will watch your condition.     Will get help right away if you are not doing well or get worse.    This information is not intended to replace advice given to you by your health care provider. Make sure you discuss any questions you have with your health care provider.    Document Released: 05/03/2001 Document Revised: 11/08/2011 Document Reviewed: 06/22/2011  Elsevier Interactive Patient Education ?2016 Elsevier Inc.

## 2019-09-09 NOTE — ED Notes (Signed)
ED Triage Note       ED Triage Adult Entered On:  09/09/2019 11:20 EDT    Performed On:  09/09/2019 11:17 EDT by Amalia Hailey, RN, [place]               Triage   Chief Complaint :   pt c/o epigastric pain since saturday.   Numeric Rating Pain Scale :   9   ED Pain Details :   Pain Details   [place] Mode of Arrival :   Walking   Infectious Disease Documentation :   Document assessment   Temperature Oral :   36.5 degC(Converted to: 97.7 degF)    Heart Rate Monitored :   69 bpm   Respiratory Rate :   20 br/min   Systolic Blood Pressure :   914 mmHg   Diastolic Blood Pressure :   78 mmHg   SpO2 :   100 %   Oxygen Therapy :   Room air   Patient presentation :   None of the above   Chief Complaint or Presentation suggest infection :   Yes   Weight Dosing :   105 kg(Converted to: 231 lb 8 oz)    Height :   188 cm(Converted to: 6 ft 2 in)    Body Mass Index Dosing :   30 kg/m2   Amalia Hailey RN, BRITTNAY - 09/09/2019 11:17 EDT   DCP GENERIC CODE   Tracking Acuity :   3   Tracking Group :   ED [employer]   [place], RN, Burman Riis - 09/09/2019 11:17 EDT   ED General Section :   Document assessment   Pregnancy Status :   N/A   ED Allergies Section :   Document assessment   ED Reason for Visit Section :   Document assessment   ED Home Meds Section :   Document assessment   Amalia Hailey RN, BRITTNAY - 09/09/2019 11:17 EDT   ID Risk Screen Symptoms   Recent Travel History :   No recent travel   Close Contact with COVID-19 ID :   No   Last 14 days COVID-19 ID :   No   VELASQUEZ, RN, BRITTNAY - 09/09/2019 11:17 EDT   Allergies   (As Of: 09/09/2019 11:20:34 EDT)     Pain Assessment   Pain Location :   Epigastric   VELASQUEZ, RN, BRITTNAY - 09/09/2019 11:17 EDT   Image 4 -  Images currently included in the form version of this document have not been included in the text rendition version of the form.   Psycho-Social   Last 3 mo, thoughts killing self/others :   Patient denies   Right click within box for Suspected Abuse policy link. :    None   Feels Unsafe at Home :   No   Amalia Hailey, RN, BRITTNAY - 09/09/2019 11:17 EDT   ED Home Med List   Medication List   (As Of: 09/09/2019 11:20:34 EDT)        ED Reason for Visit   (As Of: 09/09/2019 11:20:34 EDT)   Problems(Active)    No Chronic Problems (Cerner  :NKP )  Name of Problem:   No Chronic Problems ; Recorder:   Amalia Hailey, RN, BRITTNAY; Code:   NKP ; Last Updated:   09/09/2019 11:19 EDT ; Life Cycle Date:   09/09/2019 ; Life Cycle Status:   Active ; Vocabulary:   Cerner          Diagnoses(Active)  Epigastric Pain  Date:   09/09/2019 ; Diagnosis Type:   Reason For Visit ; Confirmation:   Complaint of ; Clinical Dx:   Epigastric Pain ; Classification:   Medical ; Clinical Service:   Emergency medicine ; Code:   PNED ; Probability:   0 ; Diagnosis Code:   [id number]

## 2019-09-09 NOTE — Discharge Summary (Signed)
ED Clinical Summary                        [facility]  [address]  [place], [place] [address]  [phone]           PERSON INFORMATION  Name: Curtis Cunningham, Curtis Cunningham Age:  34 Years DOB: 11/09/1985   Sex: Male Language: English PCP: PCP,  NONE   Marital Status: Single Phone: [phone] Med Service: MED-Medicine   MRN: [id number] Acct# [id number] Arrival: 09/09/2019 11:07:00   Visit Reason: Abdominal pain; Epigastric Pain; ABDOMINAL PAIN Acuity: 3 LOS: 000 02:10   Address:    [address] [place] [address]   Diagnosis:    Epigastric pain; Gastritis  Medications:          New Medications  Printed Prescriptions  dicyclomine (Bentyl 20 mg oral tablet) 1 Tabs Oral (given by mouth) 3 times a day as needed moderate pain (4-7) for 5 Days. Refills: 0.  Last Dose:____________________  omeprazole (PriLOSEC 20 mg oral delayed release capsule) 1 Capsules Oral (given by mouth) every day for 7 Days. Refills: 0.  Last Dose:____________________      Medications Administered During Visit:                Medication Dose Route   morphine 4 mg IV Push   iopamidol 100 mL IV Contrast   Sodium Chloride 0.9% 1000 mL IV Piggyback               Allergies      Septra (Anaphylactic reaction)      Major Tests and Procedures:  The following procedures and tests were performed during your ED visit.  COMMON PROCEDURES%>  COMMON PROCEDURES COMMENTS%>                PROVIDER INFORMATION               Provider Role Assigned Aurora Mask K-PA ED MidLevel 09/09/2019 12:10:35    Anastasia Fiedler, RN, Irving Burton ED Nurse 09/09/2019 12:18:33        Attending Physician:  Genevie Cheshire K-PA      Admit Doc  HEFFNER,  LINDSEY K-PA     Consulting Doc       VITALS INFORMATION  Vital Sign Triage Latest   Temp Oral ORAL_1%> ORAL%>   Temp Temporal TEMPORAL_1%> TEMPORAL%>   Temp Intravascular INTRAVASCULAR_1%> INTRAVASCULAR%>   Temp Axillary AXILLARY_1%> AXILLARY%>   Temp Rectal RECTAL_1%> RECTAL%>   02 Sat 100 % 99 %   Respiratory Rate RATE_1%>  RATE%>   Peripheral Pulse Rate PULSE RATE_1%> PULSE RATE%>   Apical Heart Rate HEART RATE_1%> HEART RATE%>   Blood Pressure BLOOD PRESSURE_1%>/ BLOOD PRESSURE_1%>78 mmHg BLOOD PRESSURE%> / BLOOD PRESSURE%>80 mmHg                 Immunizations      No Immunizations Documented This Visit          DISCHARGE INFORMATION   Discharge Disposition: H Outpt-Sent Home   Discharge Location:  Home   Discharge Date and Time:  09/09/2019 13:17:35   ED Checkout Date and Time:  09/09/2019 13:17:35     DEPART REASON INCOMPLETE INFORMATION               Depart Action Incomplete Reason   Interactive View/I&O Recently assessed               Problems      Active  No Chronic Problems              Smoking Status      No Smoking Status Documented         PATIENT EDUCATION INFORMATION  Instructions:     Abdominal Pain, Adult; Gastritis, Adult     Follow up:                   With: Address: When:   [facility] Call for appt & office location   [phone] Business (1) Within 1 week   Comments:   Return to ED if symptoms worsen              ED PROVIDER DOCUMENTATION

## 2019-09-09 NOTE — ED Notes (Signed)
ED Patient Education Note     Patient Education Materials Follows:  Emergency Medicine     Gastritis, Adult    Gastritis is soreness and swelling (inflammation) of the lining of the stomach. Gastritis can develop as a sudden onset (acute) or long-term (chronic) condition. If gastritis is not treated, it can lead to stomach bleeding and ulcers.      CAUSES    Gastritis occurs when the stomach lining is weak or damaged. Digestive juices from the stomach then inflame the weakened stomach lining. The stomach lining may be weak or damaged due to viral or bacterial infections. One common bacterial infection is the Helicobacter pylori  infection. Gastritis can also result from excessive alcohol consumption, taking certain medicines, or having too much acid in the stomach.     SYMPTOMS    In some cases, there are no symptoms. When symptoms are present, they may include:     Pain or a burning sensation in the upper abdomen.     Nausea.     Vomiting.     An uncomfortable feeling of fullness after eating.     DIAGNOSIS    Your caregiver may suspect you have gastritis based on your symptoms and a physical exam. To determine the cause of your gastritis, your caregiver may perform the following:     Blood or stool tests to check for the H pylori bacterium.     Gastroscopy. A thin, flexible tube (endoscope) is passed down the esophagus and into the stomach. The endoscope has a light and camera on the end. Your caregiver uses the endoscope to view the inside of the stomach.     Taking a tissue sample (biopsy) from the stomach to examine under a microscope.     TREATMENT    Depending on the cause of your gastritis, medicines may be prescribed. If you have a bacterial infection, such as an H pylori infection, antibiotics may be given. If your gastritis is caused by too much acid in the stomach, H2 blockers or antacids may be given. Your caregiver may recommend that you stop taking aspirin, ibuprofen, or other nonsteroidal  anti-inflammatory drugs (NSAIDs).    HOME CARE INSTRUCTIONS     Only take over-the-counter or prescription medicines as directed by your caregiver.     If you were given antibiotic medicines, take them as directed. Finish them even if you start to feel better.     Drink enough fluids to keep your urine clear or pale yellow.      Avoid foods and drinks that make your symptoms worse, such as:    ? Caffeine or alcoholic drinks.    ? Chocolate.    ? Peppermint or mint flavorings.    ? Garlic and onions.    ? Spicy foods.    ? Citrus fruits, such as oranges, lemons, or limes.    ? Tomato-based foods such as sauce, chili, salsa, and pizza.    ? Fried and fatty foods.     Eat small, frequent meals instead of large meals.    SEEK IMMEDIATE MEDICAL CARE IF:     You have black or dark red stools.     You vomit blood or material that looks like coffee grounds.     You are unable to keep fluids down.     Your abdominal pain gets worse.     You have a fever.     You do not feel better after 1 week.        You have any other questions or concerns.    MAKE SURE YOU:     Understand these instructions.     Will watch your condition.     Will get help right away if you are not doing well or get worse.    This information is not intended to replace advice given to you by your health care provider. Make sure you discuss any questions you have with your health care provider.    Document Released: 05/03/2001 Document Revised: 11/08/2011 Document Reviewed: 06/22/2011  Elsevier Interactive Patient Education ?2016 Elsevier Inc.         Abdominal Pain, Adult    Many things can cause abdominal pain. Usually, abdominal pain is not caused by a disease and will improve without treatment. It can often be observed and treated at home. Your health care provider will do a physical exam and possibly order blood tests and X-rays to help determine the seriousness of your pain. However, in many cases, more time must pass before a clear cause of the pain  can be found. Before that point, your health care provider may not know if you need more testing or further treatment.      HOME CARE INSTRUCTIONS    Monitor your abdominal pain for any changes. The following actions may help to alleviate any discomfort you are experiencing:     Only take over-the-counter or prescription medicines as directed by your health care provider.     Do not take laxatives unless directed to do so by your health care provider.     Try a clear liquid diet (broth, tea, or water) as directed by your health care provider. Slowly move to a bland diet as tolerated.    SEEK MEDICAL CARE IF:     You have unexplained abdominal pain.     You have abdominal pain associated with nausea or diarrhea.     You have pain when you urinate or have a bowel movement.     You experience abdominal pain that wakes you in the night.     You have abdominal pain that is worsened or improved by eating food.     You have abdominal pain that is worsened with eating fatty foods.     You have a fever.    SEEK IMMEDIATE MEDICAL CARE IF:     Your pain does not go away within 2 hours.     You keep throwing up (vomiting).     Your pain is felt only in portions of the abdomen, such as the right side or the left lower portion of the abdomen.     You pass bloody or black tarry stools.    MAKE SURE YOU:     Understand these instructions.     Will watch your condition.     Will get help right away if you are not doing well or get worse.    This information is not intended to replace advice given to you by your health care provider. Make sure you discuss any questions you have with your health care provider.    Document Released: 02/16/2005 Document Revised: 01/28/2015 Document Reviewed: 01/16/2013  Elsevier Interactive Patient Education ?2016 Elsevier Inc.

## 2019-09-09 NOTE — ED Provider Notes (Signed)
Epigastric Pain, Abdominal pain        Patient:   Curtis Cunningham, Curtis Cunningham             MRN: [id number]            FIN: [id number]               Age:   34 years     Sex:  Male     DOB:  1985-07-29   Associated Diagnoses:   Epigastric pain; Gastritis   Author:   Blenda Peals K-PA      Basic Information   Time seen: Provider Seen (ST)   ED Provider/Time:    Dinari Stgermaine,  Onyekachi Gathright K-PA / 09/09/2019 12:10  .   Additional information: Chief Complaint from Nursing Triage Note   Chief Complaint  Chief Complaint: pt c/o epigastric pain since saturday. (09/09/19 11:17:00).      History of Present Illness   This is a 34 year old male presenting today for epigastric pain.  Onset 2 days ago.  Reports constant dull pain with intermittent sharp pains.  Admits to nausea with one episode of vomiting.  Has had some looser stools.  Denies any melena hematochezia.  Has never had anything like this before.  Denies any alcohol use.  Does smoke cigarettes.  Denies frequent NSAID use.  No history of abdominal surgeries.  No fevers..        Review of Systems   Constitutional symptoms:  No fever,    Skin symptoms:  No rash,    Eye symptoms:  Vision unchanged.   Respiratory symptoms:  No shortness of breath,    Cardiovascular symptoms:  No chest pain,    Musculoskeletal symptoms:  No back pain,    Neurologic symptoms:  No headache, no dizziness.              Additional review of systems information: All other systems reviewed and otherwise negative.      Health Status   Allergies:    Allergic Reactions (Selected)  Severe  Septra- Anaphylactic reaction..   Medications:  (Selected)   Inpatient Medications  Ordered  Isovue-300: 100 mL, IV Contrast, Once  Sodium Chloride 0.9% bolus: 1,000 mL, 2000 mL/hr, IV Piggyback, Once  morphine: 4 mg, 1 mL, IV Push, Once.      Past Medical/ Family/ Social History   Medical history: Reviewed as documented in chart.   Surgical history: Reviewed as documented in chart.   Family history: Not significant.   Social history:  Reviewed as documented in chart.   Problem list:    Active Problems (1)  No Chronic Problems   .      Physical Examination               Vital Signs   Vital Signs   5/36/4680 32:12 EDT Systolic Blood Pressure 248 mmHg    Diastolic Blood Pressure 78 mmHg    Temperature Oral 36.5 degC    Heart Rate Monitored 69 bpm    Respiratory Rate 20 br/min    SpO2 100 %   .   Measurements   09/09/2019 11:20 EDT Body Mass Index est meas 29.71 kg/m2    Body Mass Index Measured 29.71 kg/m2   09/09/2019 11:17 EDT Height/Length Measured 188 cm    Weight Dosing 105 kg   .   Basic Oxygen Information   09/09/2019 11:17 EDT Oxygen Therapy Room air    SpO2 100 %   .   General:  Alert, no  acute distress.    Skin:  Warm, dry, intact.    Head:  Normocephalic, atraumatic.    Neck:  Supple, trachea midline.    Eye:  Normal conjunctiva.   Cardiovascular:  Regular rate and rhythm, No murmur.    Respiratory:  Lungs are clear to auscultation, respirations are non-labored, breath sounds are equal, Symmetrical chest wall expansion.    Gastrointestinal:  Soft, Non distended, Normal bowel sounds, Tenderness: Moderate, epigastric, Guarding: Negative, Rebound: Negative.    Neurological:  Alert and oriented to person, place, time, and situation, normal sensory observed, normal motor observed, normal speech observed.    Psychiatric:  Cooperative, appropriate mood & affect.       Medical Decision Making   Rationale:  PA/NP reviewed with co-signing physician: diagnosis and plan of care.   Documents reviewed:  Emergency department nurses' notes, flowsheet, emergency department records, prior records, vital signs.    Results review:  Lab results : Lab View   09/09/2019 12:43 EDT Estimated Creatinine Clearance 168.09 mL/min   09/09/2019 12:19 EDT WBC 5.2 x10e3/mcL    RBC 5.38 x10e6/mcL    Hgb 15.3 g/dL    HCT 45.6 %    MCV 84.8 fL    MCH 28.4 pg    MCHC 33.6 g/dL    RDW 13.3 %    Platelet 167 x10e3/mcL    MPV 11.0 fL    Neutro Auto 58.6 %    Neutro Absolute 3.1  x10e3/mcL    Immature Grans Percent 0.0 %  LOW    Immature Grans Absolute 0.00 x10e3/mcL    Lymph Auto 32.6 %    Lymph Absolute 1.7 x10e3/mcL    Mono Auto 8.0 %    Mono Absolute 0.4 x10e3/mcL    Eosinophil Percent 0.4 %    Eos Absolute 0.0 x10e3/mcL    Basophil Auto 0.4 %    Baso Absolute 0.0 x10e3/mcL    NRBC Absolute Auto 0.000 x10e3/mcL    NRBC Percent Auto 0.0 %    Sodium Lvl 139 mmol/L    Potassium Lvl 4.8 mmol/L    Chloride 105 mmol/L    CO2 27 mmol/L    Glucose Random 109 mg/dL  HI    BUN 9 mg/dL    Creatinine Lvl 0.8 mg/dL    AGAP 7 mmol/L    Osmolality Calc 277 mOsm/kg    Calcium Lvl 8.9 mg/dL    Protein Total 7.0 g/dL    Albumin Lvl 4.4 g/dL    Alk Phos 97 unit/L    AST 18 unit/L    ALT 22 unit/L    eGFR AA 135 mL/min/1.67m???    eGFR Non-AA 117 mL/min/1.754m??    Bili Total 0.38 mg/dL    Bili Direct <0.20 mg/dL    Lipase Lvl 16 unit/L    Lactic Acid Lvl 0.7 mmol/L   .   Radiology results:  Rad Results (ST)   CT Abdomen Pelvis w/ Contrast  ?  09/09/19 12:50:27  CT abdomen pelvis with contrast: 09/09/19    INDICATION:Abdominal pain, acute, nonlocalized.    COMPARISON: None    TECHNIQUE: Routine protocol (Axial portal venous phase imaging from the lung  bases to the pubic symphysis following IV contrast with coronal reconstruction.)  CT scanning was performed using radiation dose reduction techniques when  appropriate, per system protocols.    FINDINGS:  Limited lung bases clear. No significant pleural effusion. Liver and spleen  unremarkable. Gallbladder nondistended. No radiopaque gallstones. Pancreas and  both adrenal glands unremarkable.  Both kidneys are nonobstructed. 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 Mildly prominent fluid-filled loops of mid  small bowel-possible focal ileus. No evidence of bowel obstruction. Normal  appendix. Mild colonic constipation noted. Urinary bladder relatively  decompressed. There is no ascites, no pneumatosis, no free air. Abdominal aorta  normal in caliber.  Chronic appearing mild deformity right transverse process of  L3, L4 likely due to remote trauma. No aggressive bone lesions.    IMPRESSION:    1. Fluid-filled mildly distended mid small bowel loops. Findings could represent  mild ileus, possible viral syndrome or gastroenteritis. No complicating  features, no evidence of bowel obstruction. If the patients symptoms persist,  short-term follow-up MR enterography could be obtained as needed.  2. Normal appendix. Solitary 2 mm nonobstructing nephrolith right kidney.  ?  Signed By: Anice Paganini P-MD  .      Reexamination/ Reevaluation   Notes: This is a 34 year old male presenting today for 2 days of epigastric pain.  Patient is afebrile and hemodynamically stable on arrival, mildly uncomfortable but nontoxic-appearing.  Reports constant dull epigastric pain with intermittent sharp pains.  Admits to intermittent nausea with one episode of vomiting.  Mild loose stools.  No fevers.  No history abdominal surgeries.  Denies any alcohol or drug use.  Patient has moderate epigastric tenderness on exam.  No rebound or guarding.  Otherwise his exam is unremarkable.  Patient's labs are stable today including CBC, BMP, lipase and LFTs.  CT scan with possible gastroenteritis but no other acute findings.  Suspect symptoms are likely related to viral gastroenteritis versus gastritis/PUD.  Will start patient on PPI and Bentyl to use as needed.  Given gastroenterology follow-up.  Precautions to return including worsening pain, vomiting, fevers or other concerns.  Patient is stable for discharge..      Impression and Plan   Diagnosis   Epigastric pain (ICD10-CM R10.13, Discharge, Medical)   Gastritis (ICD10-CM K29.70, Discharge, Medical)   Plan   Condition: Stable.    Disposition: Discharged: Time  09/09/2019 13:07:00, to home.    Prescriptions: Launch prescriptions   Pharmacy:  Bentyl 20 mg oral tablet (Prescribe): 20 mg, 1 tabs, Oral, TID, for 5 days, PRN: moderate pain (4-7), 15  tabs, 0 Refill(s)  PriLOSEC 20 mg oral delayed release capsule (Prescribe): 20 mg, 1 caps, Oral, Daily, for 7 days, 7 caps, 0 Refill(s).    Patient was given the following educational materials: Gastritis, Adult, Abdominal Pain, Adult.    Follow up with: [facility] Within 1 week Return to ED if symptoms worsen.    Counseled: I had a detailed discussion with the patient and/or guardian regarding the historical points/exam findings supporting the discharge diagnosis and need for outpatient followup. Discussed the need to return to the ER if symptoms persist/worsen, or for any questions/concerns that arise at home.    Signature Line     Electronically Signed on 09/09/2019 01:29 PM EDT   ________________________________________________   Blenda Peals K-PA               Modified by: Blenda Peals K-PA on 09/09/2019 01:29 PM EDTAddendum by Charlies Constable H-MD on September 09, 2019 14:10 EDT               09/09/2019 14:10:46: I reviewed the physician assistant's documentation and the patient chart. I agree with the current treatment plan as prescribed. Please refer to documentation for further details.  Librarian, academic  Signed on 09/09/2019 02:10 PM EDT   ________________________________________________   Charlies Constable H-MD               Modified by: Charlies Constable H-MD on 09/09/2019 02:10 PM EDT
